# Patient Record
Sex: Female | Born: 1955 | Race: White | Hispanic: No | Marital: Married | Smoking: Never smoker
Health system: Southern US, Community
[De-identification: ages and names within clinical notes are randomized; demographics above are authoritative.]

## PROBLEM LIST (undated history)

## (undated) DIAGNOSIS — Z5189 Encounter for other specified aftercare: Secondary | ICD-10-CM

## (undated) DIAGNOSIS — K219 Gastro-esophageal reflux disease without esophagitis: Secondary | ICD-10-CM

## (undated) DIAGNOSIS — Z860100 Personal history of colon polyps, unspecified: Secondary | ICD-10-CM

## (undated) DIAGNOSIS — M1990 Unspecified osteoarthritis, unspecified site: Secondary | ICD-10-CM

## (undated) DIAGNOSIS — L719 Rosacea, unspecified: Secondary | ICD-10-CM

## (undated) DIAGNOSIS — C801 Malignant (primary) neoplasm, unspecified: Secondary | ICD-10-CM

## (undated) DIAGNOSIS — K649 Unspecified hemorrhoids: Secondary | ICD-10-CM

## (undated) DIAGNOSIS — F419 Anxiety disorder, unspecified: Secondary | ICD-10-CM

## (undated) DIAGNOSIS — T8859XA Other complications of anesthesia, initial encounter: Secondary | ICD-10-CM

## (undated) DIAGNOSIS — R7989 Other specified abnormal findings of blood chemistry: Secondary | ICD-10-CM

## (undated) DIAGNOSIS — E162 Hypoglycemia, unspecified: Secondary | ICD-10-CM

## (undated) HISTORY — DX: Gastro-esophageal reflux disease without esophagitis: K21.9

## (undated) HISTORY — DX: Encounter for other specified aftercare: Z51.89

## (undated) HISTORY — DX: Other specified abnormal findings of blood chemistry: R79.89

## (undated) HISTORY — DX: Unspecified osteoarthritis, unspecified site: M19.90

## (undated) HISTORY — DX: Unspecified hemorrhoids: K64.9

## (undated) HISTORY — DX: Hypoglycemia, unspecified: E16.2

## (undated) HISTORY — DX: Personal history of colon polyps, unspecified: Z86.0100

## (undated) HISTORY — DX: Rosacea, unspecified: L71.9

## (undated) HISTORY — PX: FINGER SURGERY: SHX640

## (undated) HISTORY — DX: Malignant (primary) neoplasm, unspecified: C80.1

## (undated) HISTORY — DX: Anxiety disorder, unspecified: F41.9

## (undated) HISTORY — DX: Personal history of colonic polyps: Z86.010

---

## 1992-05-25 HISTORY — PX: KNEE ARTHROSCOPY: SUR90

## 1993-05-25 HISTORY — PX: TUBAL LIGATION: SHX77

## 1994-05-25 HISTORY — PX: OTHER SURGICAL HISTORY: SHX169

## 2002-05-25 HISTORY — PX: UTERINE FIBROID SURGERY: SHX826

## 2005-05-25 DIAGNOSIS — Z5189 Encounter for other specified aftercare: Secondary | ICD-10-CM

## 2005-05-25 HISTORY — DX: Encounter for other specified aftercare: Z51.89

## 2008-03-22 ENCOUNTER — Encounter: Payer: Self-pay | Admitting: Internal Medicine

## 2008-03-22 ENCOUNTER — Ambulatory Visit: Payer: Self-pay | Admitting: Internal Medicine

## 2008-03-22 DIAGNOSIS — I10 Essential (primary) hypertension: Secondary | ICD-10-CM | POA: Insufficient documentation

## 2008-05-29 ENCOUNTER — Ambulatory Visit: Payer: Self-pay | Admitting: Internal Medicine

## 2008-05-29 DIAGNOSIS — J069 Acute upper respiratory infection, unspecified: Secondary | ICD-10-CM | POA: Insufficient documentation

## 2008-10-02 ENCOUNTER — Ambulatory Visit: Payer: Self-pay | Admitting: Internal Medicine

## 2008-10-02 DIAGNOSIS — L570 Actinic keratosis: Secondary | ICD-10-CM

## 2008-11-06 ENCOUNTER — Telehealth: Payer: Self-pay | Admitting: Internal Medicine

## 2008-12-18 ENCOUNTER — Emergency Department (HOSPITAL_COMMUNITY): Admission: EM | Admit: 2008-12-18 | Discharge: 2008-12-18 | Payer: Self-pay | Admitting: Emergency Medicine

## 2009-07-24 ENCOUNTER — Ambulatory Visit: Payer: Self-pay | Admitting: Internal Medicine

## 2009-07-24 ENCOUNTER — Telehealth (INDEPENDENT_AMBULATORY_CARE_PROVIDER_SITE_OTHER): Payer: Self-pay | Admitting: *Deleted

## 2009-07-24 DIAGNOSIS — M79609 Pain in unspecified limb: Secondary | ICD-10-CM

## 2009-10-24 ENCOUNTER — Telehealth: Payer: Self-pay | Admitting: Internal Medicine

## 2010-02-04 ENCOUNTER — Ambulatory Visit: Payer: Self-pay | Admitting: Internal Medicine

## 2010-02-04 DIAGNOSIS — G5600 Carpal tunnel syndrome, unspecified upper limb: Secondary | ICD-10-CM

## 2010-03-14 ENCOUNTER — Ambulatory Visit: Payer: Self-pay | Admitting: Internal Medicine

## 2010-03-14 DIAGNOSIS — D1739 Benign lipomatous neoplasm of skin and subcutaneous tissue of other sites: Secondary | ICD-10-CM | POA: Insufficient documentation

## 2010-05-25 DIAGNOSIS — C801 Malignant (primary) neoplasm, unspecified: Secondary | ICD-10-CM

## 2010-05-25 HISTORY — DX: Malignant (primary) neoplasm, unspecified: C80.1

## 2010-05-25 HISTORY — PX: MOHS SURGERY: SHX181

## 2010-06-12 ENCOUNTER — Encounter: Payer: Self-pay | Admitting: Internal Medicine

## 2010-06-24 NOTE — Assessment & Plan Note (Signed)
Summary: L arm, shoulder pain x few days   Vital Signs:  Patient profile:   55 year old female Weight:      150 pounds Temp:     98.9 degrees F oral BP sitting:   118 / 78  (right arm) Cuff size:   regular  Vitals Entered By: Duard Brady LPN (July 24, 2593 11:20 AM) CC: c/o (L) arm and shoulder pain x2 days , c/o dizziness on/off x2 wks , (L) hand "tingle"  Is Patient Diabetic? No   Primary Care Provider:  Gordy Savers  MD  CC:  c/o (L) arm and shoulder pain x2 days , c/o dizziness on/off x2 wks , and (L) hand "tingle" .  History of Present Illness: 55 year old patient who presents with a two-day history of vague left arm and shoulder discomfort.  She also describes some mild dizziness.  Associated symptoms include some numbness involving her left lateral arm, extending to the fourth and fifth fingers.  She was concerned about a cardiac etiology.  She denies any exertional chest pain.  Exertion does not aggravate the above symptoms.  There is full range-of-motion of the head, neck, and arms without aggravation of the pain.  She does describe some increasing dyspnea and palpitations that occur rarely with walking up two flights of stairs. She has treated hypertension. She also has a history of multiple actinic keratoses and is requesting dermatology referral  Preventive Screening-Counseling & Management  Alcohol-Tobacco     Smoking Status: never  Allergies (verified): No Known Drug Allergies  Past History:  Past Medical History: Reviewed history from 05/29/2008 and no changes required. Hypertension actinic keratoses history of thyroid nodule psoriasis  Past Surgical History: Reviewed history from 03/22/2008 and no changes required. status post biopsy of thyroid nodule 2009 status post uterine ablation 2000 status post uterine fibroid removal 2007 status post full  Facial resurfacing 96 with laser resurfacing arms and legs  Review of Systems  The  patient denies anorexia, fever, weight loss, weight gain, vision loss, decreased hearing, hoarseness, chest pain, syncope, dyspnea on exertion, peripheral edema, prolonged cough, headaches, hemoptysis, abdominal pain, melena, hematochezia, severe indigestion/heartburn, hematuria, incontinence, genital sores, muscle weakness, suspicious skin lesions, transient blindness, difficulty walking, depression, unusual weight change, abnormal bleeding, enlarged lymph nodes, angioedema, and breast masses.    Physical Exam  General:  Well-developed,well-nourished,in no acute distress; alert,appropriate and cooperative throughout examination Head:  Normocephalic and atraumatic without obvious abnormalities. No apparent alopecia or balding. Eyes:  No corneal or conjunctival inflammation noted. EOMI. Perrla. Funduscopic exam benign, without hemorrhages, exudates or papilledema. Vision grossly normal. Mouth:  Oral mucosa and oropharynx without lesions or exudates.  Teeth in good repair. Neck:  No deformities, masses, or tenderness noted. Lungs:  Normal respiratory effort, chest expands symmetrically. Lungs are clear to auscultation, no crackles or wheezes. Heart:  Normal rate and regular rhythm. S1 and S2 normal without gallop, murmur, click, rub or other extra sounds. Abdomen:  Bowel sounds positive,abdomen soft and non-tender without masses, organomegaly or hernias noted. Msk:  range of motion left shoulder is intact and did not reproduce the pain Neurologic:  strength normal in all extremities, sensation intact to light touch, sensation intact to pinprick, and DTRs symmetrical and normal.   Skin:  fair complexion   Impression & Recommendations:  Problem # 1:  HYPERTENSION (ICD-401.9)  Her updated medication list for this problem includes:    Lisinopril 10 Mg Tabs (Lisinopril) .Marland Kitchen... 1 once daily  Problem # 2:  ARM PAIN, LEFT (ICD-729.5) extremely low likelihood that this represents ischemic pain, which  was the patient's main concern.  Neurologically is intact.  Symptoms may be due to a mild cervical radiculopathy.  In view of her paresthesias.  Will clinically observe at this time  Complete Medication List: 1)  Lisinopril 10 Mg Tabs (Lisinopril) .Marland Kitchen.. 1 once daily 2)  Hydrocodone-homatropine 5-1.5 Mg/55ml Syrp (Hydrocodone-homatropine) .Marland Kitchen.. 1 teaspoon every 6 hours for cough 3)  Halobetasol Propionate 0.05 % Crea (Halobetasol propionate) .... Use twice daily  Other Orders: Dermatology Referral (Derma)  Patient Instructions: 1)  call if symptoms  do not improve or worsen 2)  Limit your Sodium (Salt). 3)  It is important that you exercise regularly at least 20 minutes 5 times a week. If you develop chest pain, have severe difficulty breathing, or feel very tired , stop exercising immediately and seek medical attention. 4)  Take calcium +Vitamin D daily. 5)  dermatology follow-up is recommended

## 2010-06-24 NOTE — Progress Notes (Signed)
Summary: arm pain  Phone Note Call from Patient Call back at Home Phone [phone]   Caller: Patient Call For: Gordy Savers  MD Summary of Call: C/o dizziness, L arm and shoulder pain x few days. No SOB, nausea or weakness. Initial call taken by: Raechel Ache, RN,  July 24, 2009 10:04 AM  Follow-up for Phone Call        appt 11:15 today Dr Kirtland Bouchard. Follow-up by: Raechel Ache, RN,  July 24, 2009 10:04 AM

## 2010-06-24 NOTE — Assessment & Plan Note (Signed)
Summary: PAIN IN BOTH WRISTS/NUMBNESS IN FINGERS FOR 7 WKS/PAIN LFT EL...   Vital Signs:  Patient profile:   55 year old female Weight:      148 pounds Temp:     98.7 degrees F oral BP sitting:   108 / 70  (left arm) Cuff size:   regular  Vitals Entered By: Duard Brady LPN (February 04, 2010 2:33 PM)  CC: numbness in fingers running up to forearms , elbow pain - pain is worse at night, Hypertension Management Is Patient Diabetic? No   Primary Care Provider:  Gordy Savers  MD  CC:  numbness in fingers running up to forearms , elbow pain - pain is worse at night, and Hypertension Management.  History of Present Illness: 55 year old patient, who presents with a 7 to 8 week history of pain and numbness involving both hands.  The left greater than the right.  Pain is in the distribution of the thumb, second, and third fingers.  She has pain on the left as far as the elbow.  Pain is worse at night and interferes with sleep.  She has been using anti-inflammatory medication and wrist splints.  She has treated hypertension, which has been stable .  He also complains of an A. K. about her left elbow area and is requesting cryotherapy  Hypertension History:      Positive major cardiovascular risk factors include hypertension.  Negative major cardiovascular risk factors include female age less than 77 years old and non-tobacco-user status.     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 1 once daily 2)  Halobetasol Propionate 0.05 % Crea (Halobetasol propionate) .... Use twice daily 3)  Diclofenac Sodium 75 Mg Tbec (Diclofenac sodium) .... Given to her by doctor at work  Hypertension Assessment/Plan:      The patient's hypertensive risk group is category A: No risk factors and no target organ damage.  Today's blood pressure is 108/70.    Patient Instructions: 1)  Limit your Sodium (Salt). 2)  It is important that you exercise regularly at least 20 minutes 5 times a week. If you develop chest pain, have severe difficulty breathing, or feel very tired , stop exercising immediately and seek medical attention.

## 2010-06-24 NOTE — Progress Notes (Signed)
Summary: REFILL REQUEST  Phone Note Call from Patient   Caller: Patient  [phone] Summary of Call: Pt called to adv that [facility] [address] is advising her that they have not received any info on a refill for med: LISINOPRIL 10 MG ..... Pt req that they be contacted and adv of refill authorization.Marland KitchenMarland KitchenMarland KitchenPt was advised of notation indicating refill on 10/22/2009 but pt adv [facility] is stating [facility] have not [facility] anything...Marland KitchenMarland KitchenMarland Kitchen Pt can be reached at [phone].  Initial call taken by: Debbra Riding,  October 24, 2009 12:28 PM  Follow-up for Phone Call        attempt to call - number taken recorded wrong? - called hm# - ans mach - left msg that [facility] at [facility] Follow-up by: Duard Brady LPN,  October 25, 6642 2:35 PM

## 2010-06-24 NOTE — Assessment & Plan Note (Signed)
Summary: consult IR:WERXV filled lump on back/cjr   Vital Signs:  Patient profile:   55 year old female Height:      64.5 inches Weight:      142 pounds BMI:     24.08 Temp:     98.2 degrees F oral Pulse rate:   72 / minute Resp:     14 per minute BP sitting:   120 / 80  (left arm)  Vitals Entered By: Willy Eddy, LPN (March 14, 2010 8:19 AM) CC: c/o lipoma type lesion on back with no pain Is Patient Diabetic? No   Primary Care Provider:  Gordy Savers  MD  CC:  c/o lipoma type lesion on back with no pain.  History of Present Illness: 55 year old patient who has treated hypertension.  For the past several weeks she has  noted a painless subcutaneous nodule in the right mid back region.  Over this period  of observation.  It has not changed.  This was initially noted by her massage therapist.  She has treated hypertension, which has been stable.  Preventive Screening-Counseling & Management  Alcohol-Tobacco     Smoking Status: never  Current Problems (verified): 1)  Carpal Tunnel Syndrome, Bilateral  (ICD-354.0) 2)  Arm Pain, Left  (ICD-729.5) 3)  Actinic Keratosis  (ICD-702.0) 4)  Uri  (ICD-465.9) 5)  Preventive Health Care  (ICD-V70.0) 6)  Hypertension  (ICD-401.9)  Current Medications (verified): 1)  Lisinopril 10 Mg Tabs (Lisinopril) .Marland Kitchen.. 1 Once Daily 2)  Halobetasol Propionate 0.05 % Crea (Halobetasol Propionate) .... Use Twice Daily  Allergies (verified): No Known Drug Allergies  Contraindications/Deferment of Procedures/Staging:    Test/Procedure: Pneumovax vaccine    Reason for deferment: patient declined   Past History:  Past Medical History: Reviewed history from 05/29/2008 and no changes required. Hypertension actinic keratoses history of thyroid nodule psoriasis  Past Surgical History: Reviewed history from 03/22/2008 and no changes required. status post biopsy of thyroid nodule 2009 status post uterine ablation 2000 status post  uterine fibroid removal 2007 status post full  Facial resurfacing 96 with laser resurfacing arms and legs  Review of Systems       The patient complains of suspicious skin lesions.  The patient denies anorexia, fever, weight loss, weight gain, vision loss, decreased hearing, hoarseness, chest pain, syncope, dyspnea on exertion, peripheral edema, prolonged cough, headaches, hemoptysis, abdominal pain, melena, hematochezia, severe indigestion/heartburn, hematuria, incontinence, genital sores, muscle weakness, transient blindness, difficulty walking, depression, unusual weight change, abnormal bleeding, enlarged lymph nodes, angioedema, and breast masses.    Physical Exam  General:  Well-developed,well-nourished,in no acute distress; alert,appropriate and cooperative throughout examination; 118/70 Skin:  approximate 3 x 4 soft, freely movable subcutaneous nodule in the right mid back region, consistent with a lipoma   Impression & Recommendations:  Problem # 1:  LIPOMA, SKIN (ICD-214.1)  Problem # 2:  HYPERTENSION (ICD-401.9)  Her updated medication list for this problem includes:    Lisinopril 10 Mg Tabs (Lisinopril) .Marland Kitchen... 1 once daily  Her updated medication list for this problem includes:    Lisinopril 10 Mg Tabs (Lisinopril) .Marland Kitchen... 1 once daily  Complete Medication List: 1)  Lisinopril 10 Mg Tabs (Lisinopril) .Marland Kitchen.. 1 once daily 2)  Halobetasol Propionate 0.05 % Crea (Halobetasol propionate) .... Use twice daily  Patient Instructions: 1)  call if the nodule involving the right mid back area enlarges or becomes painful or red 2)  Limit your Sodium (Salt). 3)  Check your Blood  Pressure regularly. If it is above: 150/90 you should make an appointment.   Orders Added: 1)  Est. Patient Level III [16109]

## 2010-07-02 NOTE — Letter (Signed)
Summary: [facility]   Imported By: Maryln Gottron 06/27/2010 12:43:59  _____________________________________________________________________  External Attachment:    Type:   Image     Comment:   External Document

## 2010-07-21 ENCOUNTER — Other Ambulatory Visit: Payer: Self-pay | Admitting: Internal Medicine

## 2010-11-28 ENCOUNTER — Other Ambulatory Visit: Payer: Self-pay | Admitting: Obstetrics & Gynecology

## 2010-11-28 DIAGNOSIS — Z1231 Encounter for screening mammogram for malignant neoplasm of breast: Secondary | ICD-10-CM

## 2010-12-08 ENCOUNTER — Ambulatory Visit
Admission: RE | Admit: 2010-12-08 | Discharge: 2010-12-08 | Disposition: A | Discharge status: Home / Self Care | Source: Ambulatory Visit | Attending: Obstetrics & Gynecology | Admitting: Obstetrics & Gynecology

## 2010-12-08 DIAGNOSIS — Z1231 Encounter for screening mammogram for malignant neoplasm of breast: Secondary | ICD-10-CM

## 2010-12-11 ENCOUNTER — Ambulatory Visit: Payer: Self-pay

## 2011-05-23 ENCOUNTER — Other Ambulatory Visit: Payer: Self-pay | Admitting: Internal Medicine

## 2011-11-03 ENCOUNTER — Other Ambulatory Visit: Payer: Self-pay | Admitting: Obstetrics & Gynecology

## 2011-11-03 DIAGNOSIS — Z1231 Encounter for screening mammogram for malignant neoplasm of breast: Secondary | ICD-10-CM

## 2011-12-09 ENCOUNTER — Ambulatory Visit
Admission: RE | Admit: 2011-12-09 | Discharge: 2011-12-09 | Disposition: A | Discharge status: Home / Self Care | Source: Ambulatory Visit | Attending: Obstetrics & Gynecology | Admitting: Obstetrics & Gynecology

## 2011-12-09 DIAGNOSIS — Z1231 Encounter for screening mammogram for malignant neoplasm of breast: Secondary | ICD-10-CM

## 2012-11-01 ENCOUNTER — Other Ambulatory Visit: Payer: Self-pay

## 2012-11-01 DIAGNOSIS — Z1231 Encounter for screening mammogram for malignant neoplasm of breast: Secondary | ICD-10-CM

## 2012-12-09 ENCOUNTER — Ambulatory Visit
Admission: RE | Admit: 2012-12-09 | Discharge: 2012-12-09 | Disposition: A | Discharge status: Home / Self Care | Source: Ambulatory Visit

## 2012-12-09 DIAGNOSIS — Z1231 Encounter for screening mammogram for malignant neoplasm of breast: Secondary | ICD-10-CM

## 2012-12-22 ENCOUNTER — Other Ambulatory Visit: Payer: Self-pay | Admitting: Obstetrics & Gynecology

## 2012-12-22 DIAGNOSIS — Z780 Asymptomatic menopausal state: Secondary | ICD-10-CM

## 2013-01-02 ENCOUNTER — Ambulatory Visit
Admission: RE | Admit: 2013-01-02 | Discharge: 2013-01-02 | Disposition: A | Discharge status: Home / Self Care | Source: Ambulatory Visit | Attending: Obstetrics & Gynecology | Admitting: Obstetrics & Gynecology

## 2013-01-02 DIAGNOSIS — Z780 Asymptomatic menopausal state: Secondary | ICD-10-CM

## 2013-11-07 ENCOUNTER — Other Ambulatory Visit: Payer: Self-pay

## 2013-11-07 DIAGNOSIS — Z1231 Encounter for screening mammogram for malignant neoplasm of breast: Secondary | ICD-10-CM

## 2013-12-11 ENCOUNTER — Ambulatory Visit
Admission: RE | Admit: 2013-12-11 | Discharge: 2013-12-11 | Disposition: A | Discharge status: Home / Self Care | Source: Ambulatory Visit

## 2013-12-11 DIAGNOSIS — Z1231 Encounter for screening mammogram for malignant neoplasm of breast: Secondary | ICD-10-CM

## 2014-11-07 ENCOUNTER — Other Ambulatory Visit: Payer: Self-pay

## 2014-11-07 DIAGNOSIS — Z1231 Encounter for screening mammogram for malignant neoplasm of breast: Secondary | ICD-10-CM

## 2014-12-14 ENCOUNTER — Ambulatory Visit
Admission: RE | Admit: 2014-12-14 | Discharge: 2014-12-14 | Disposition: A | Discharge status: Home / Self Care | Source: Ambulatory Visit

## 2014-12-14 DIAGNOSIS — Z1231 Encounter for screening mammogram for malignant neoplasm of breast: Secondary | ICD-10-CM

## 2015-11-27 ENCOUNTER — Other Ambulatory Visit: Payer: Self-pay | Admitting: Internal Medicine

## 2015-11-27 ENCOUNTER — Ambulatory Visit
Admission: RE | Admit: 2015-11-27 | Discharge: 2015-11-27 | Disposition: A | Discharge status: Home / Self Care | Source: Ambulatory Visit | Attending: Internal Medicine | Admitting: Internal Medicine

## 2015-11-27 DIAGNOSIS — M25532 Pain in left wrist: Secondary | ICD-10-CM

## 2015-11-27 DIAGNOSIS — M25531 Pain in right wrist: Secondary | ICD-10-CM

## 2015-11-27 IMAGING — CR DG WRIST COMPLETE 3+V*L*
4 series · 4 of 4 positions shown · non-contrast
Comparison: No recent.

CLINICAL DATA: Fall.  Initial evaluation .

EXAM:
LEFT WRIST - COMPLETE 3+ VIEW

[x wrist pa left]
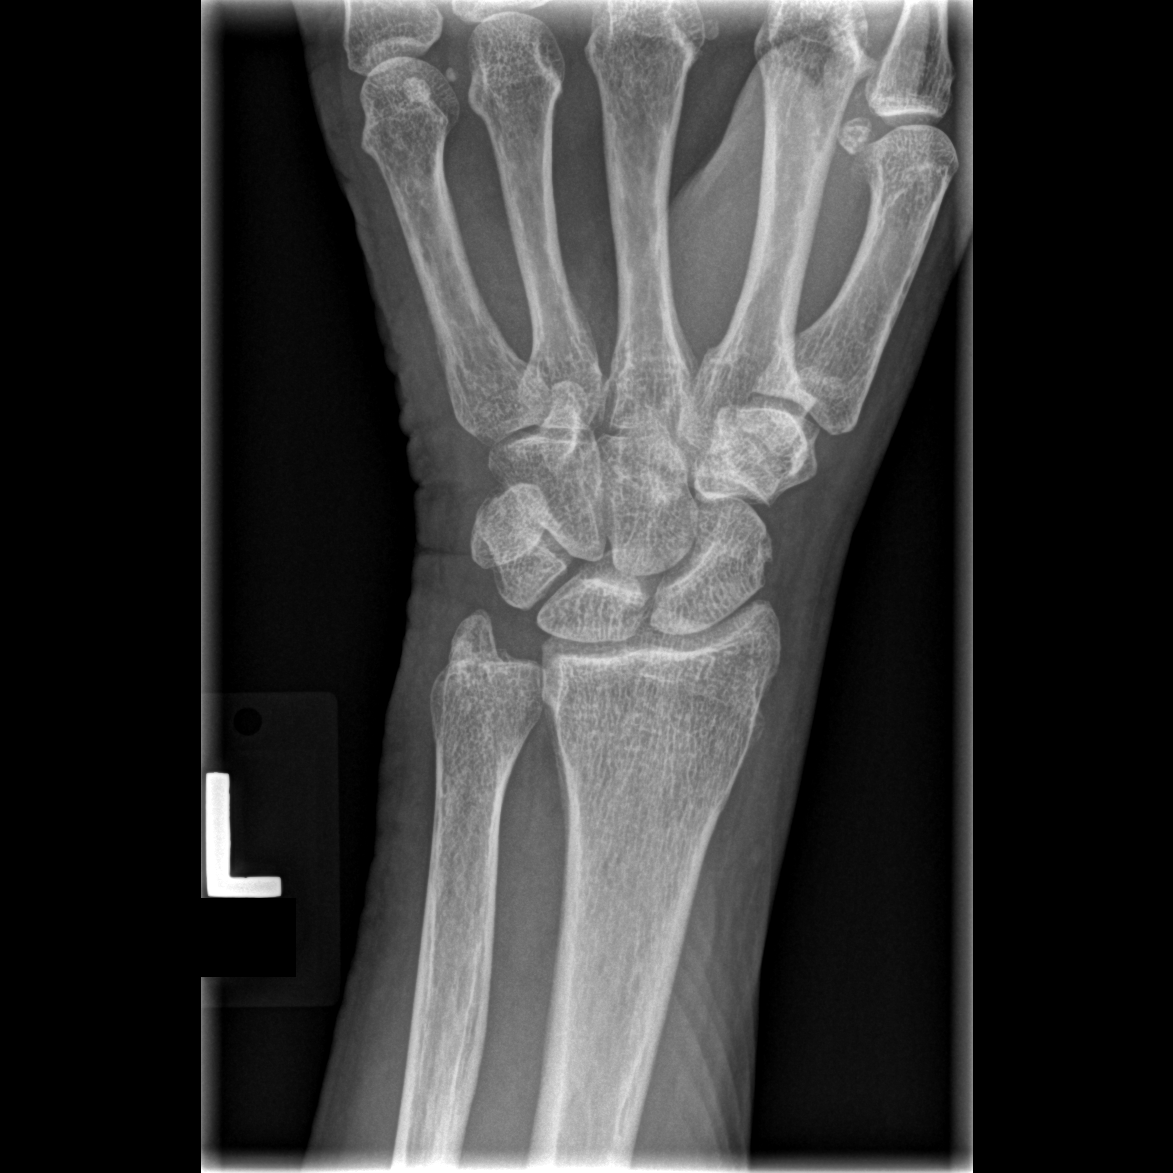

[x wrist obl left]
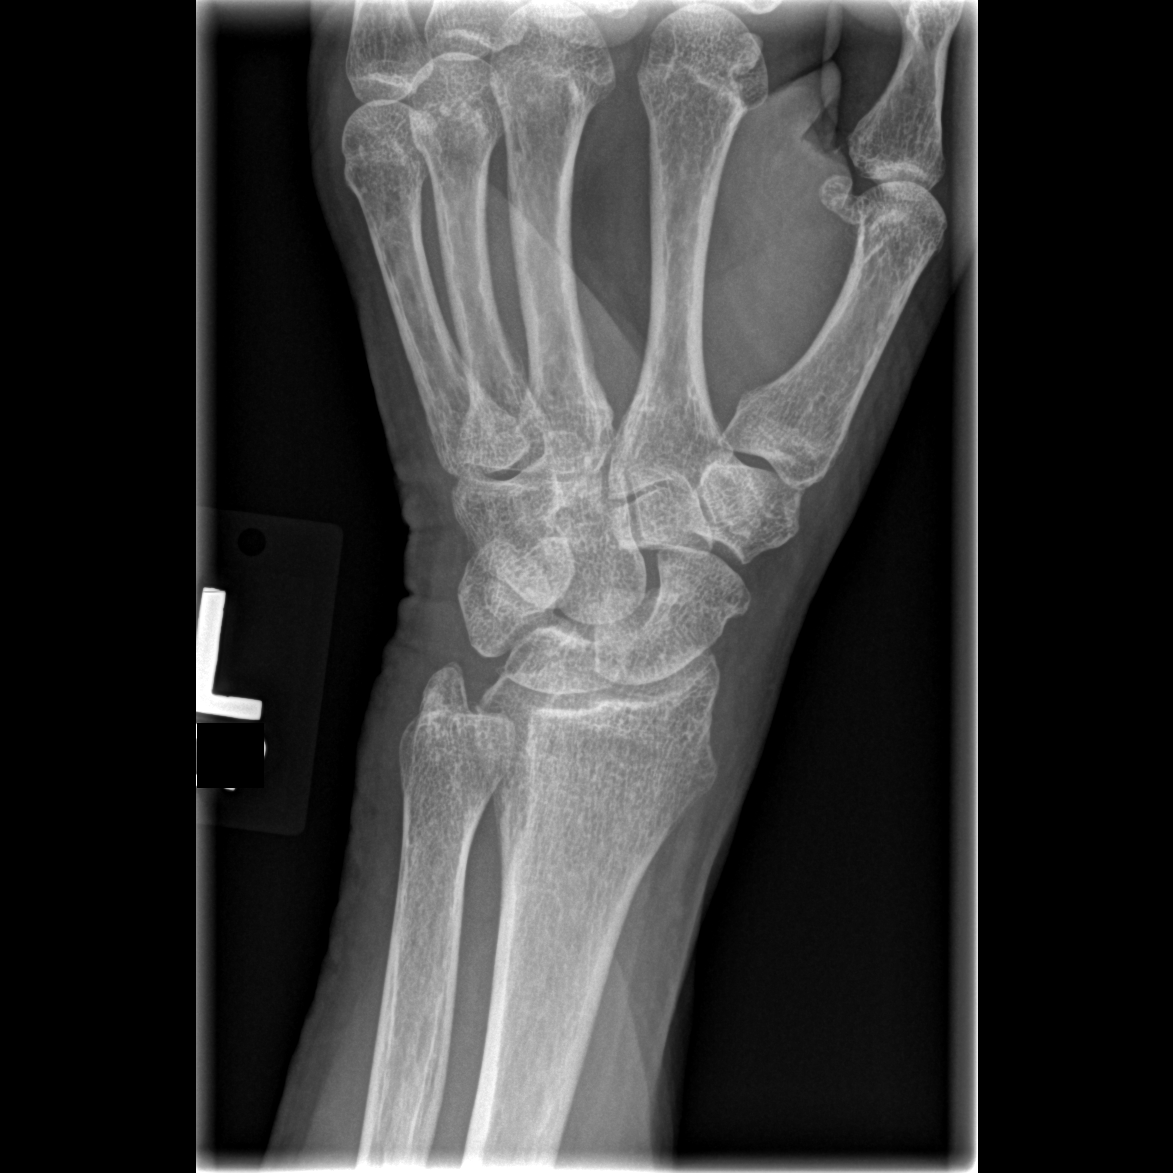

[x wrist lat left]
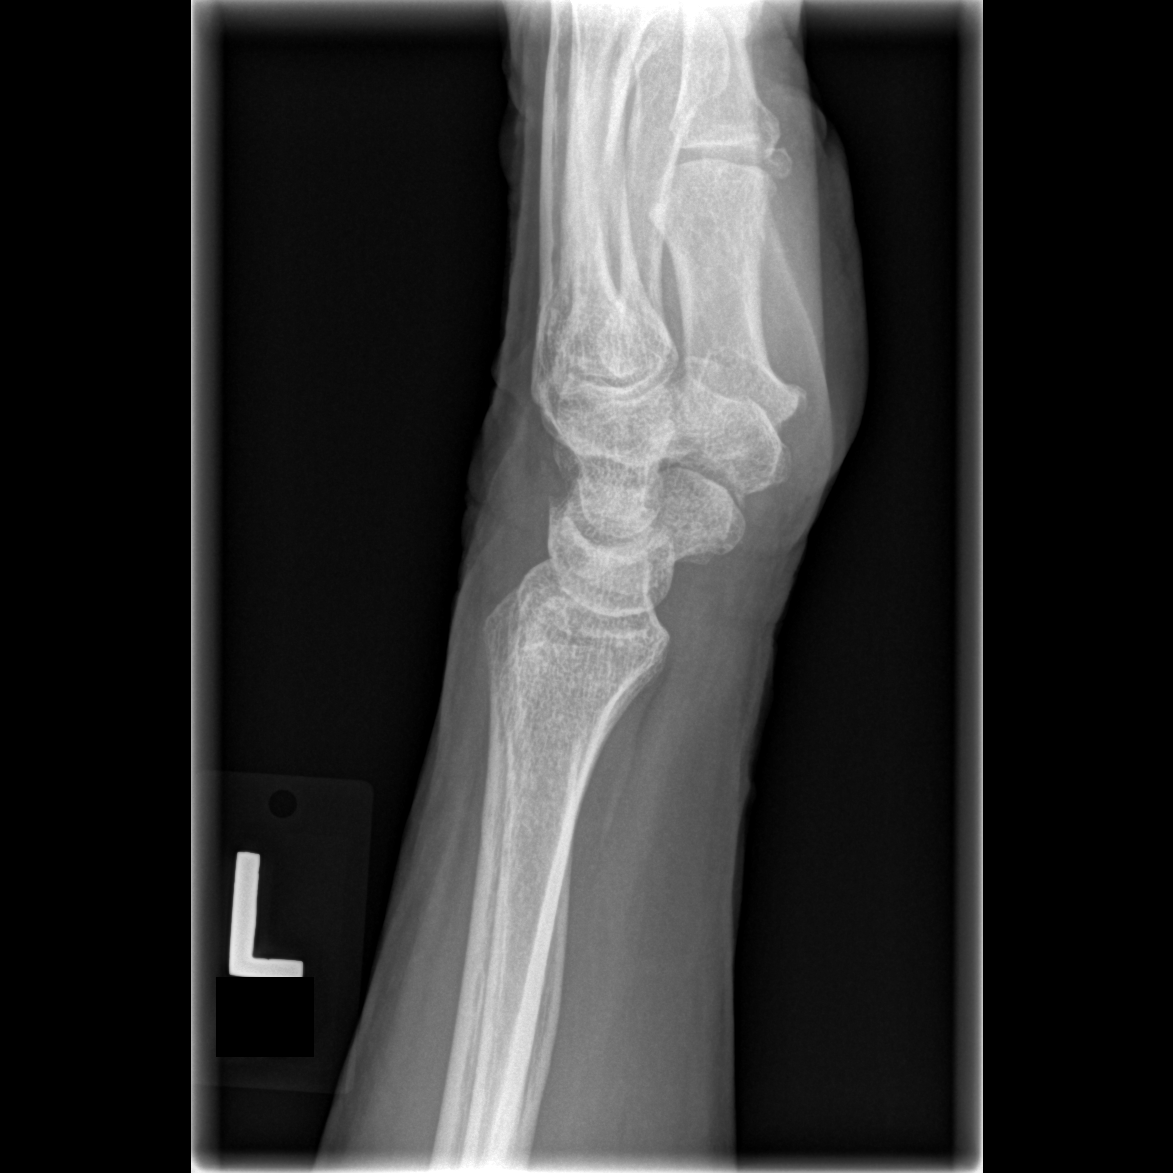

[x navicular]
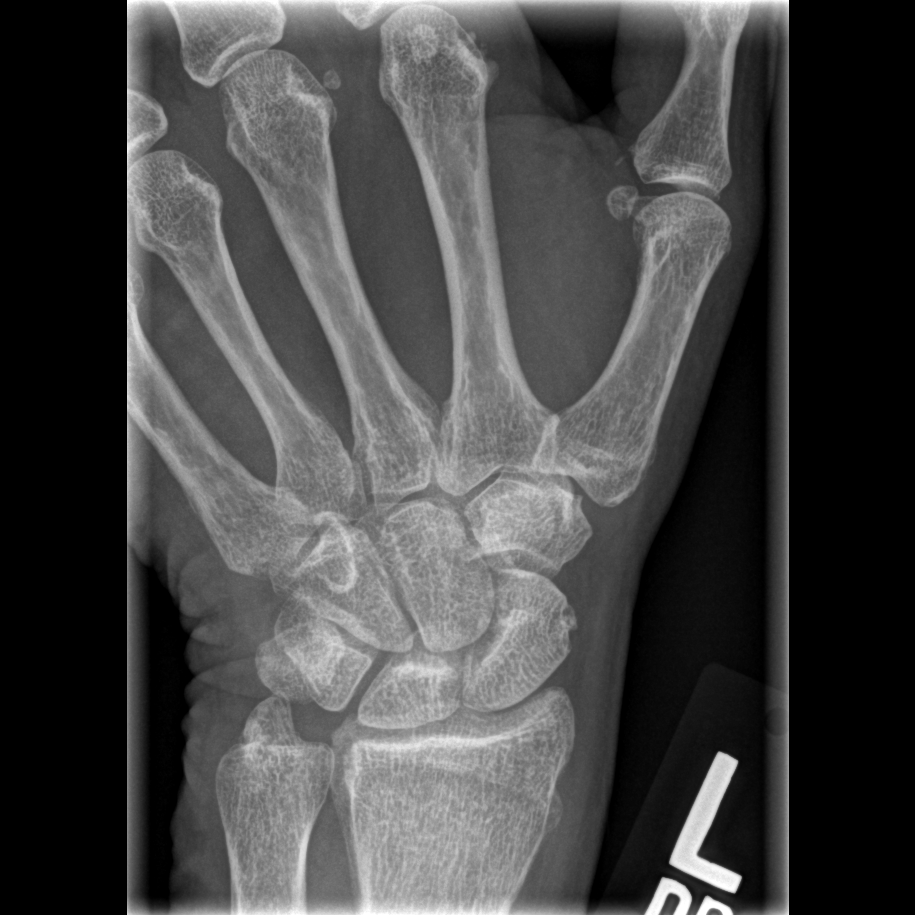

[4 of 4 positions shown; findings below may reference images not displayed]

FINDINGS: Diffuse degenerative change. No acute bony or joint abnormality
identified. No focal abnormality identified.
IMPRESSION: Diffuse mild degenerative change.  No acute abnormality.

## 2016-01-15 ENCOUNTER — Encounter: Payer: Self-pay | Admitting: Gastroenterology

## 2016-03-06 ENCOUNTER — Encounter: Payer: Self-pay | Admitting: Gastroenterology

## 2016-03-06 ENCOUNTER — Ambulatory Visit (AMBULATORY_SURGERY_CENTER): Payer: Self-pay | Admitting: *Deleted

## 2016-03-06 ENCOUNTER — Telehealth: Payer: Self-pay | Admitting: Gastroenterology

## 2016-03-06 VITALS — Ht 64.5 in | Wt 146.4 lb

## 2016-03-06 DIAGNOSIS — Z1211 Encounter for screening for malignant neoplasm of colon: Secondary | ICD-10-CM

## 2016-03-06 MED ORDER — NA SULFATE-K SULFATE-MG SULF 17.5-3.13-1.6 GM/177ML PO SOLN: 1.0000 | Freq: Once | ORAL | 0 refills | Status: AC

## 2016-03-06 NOTE — Progress Notes (Signed)
No allergies to eggs or soy. No problems with anesthesia.  Pt given Emmi instructions for colonoscopy  No oxygen use  No diet drug use  

## 2016-03-09 ENCOUNTER — Telehealth: Payer: Self-pay | Admitting: Gastroenterology

## 2016-03-09 NOTE — Telephone Encounter (Signed)
L/M that we are waiting on Suprep sample kits to come in and we will give her a call when they arrive

## 2016-03-09 NOTE — Telephone Encounter (Signed)
See other phone note. Waiting on shipment of Suprep to come in. Abran Cantor staff message to put patient on the list

## 2016-03-09 NOTE — Telephone Encounter (Signed)
Sent Magda Paganini message to give this patient the sample

## 2016-03-12 NOTE — Telephone Encounter (Signed)
I haven't called her yet.  Calling most of the people on the list tomorrow and I will document it

## 2016-03-12 NOTE — Telephone Encounter (Signed)
Ana Graves this is the phone note where patient called for the prep   I think you have called her already

## 2016-03-13 NOTE — Telephone Encounter (Signed)
Patient understands that she will get a call sometime next week about her Suprep kit when it comes in

## 2016-03-16 ENCOUNTER — Telehealth: Payer: Self-pay

## 2016-03-16 NOTE — Telephone Encounter (Signed)
Spoke with patient and told her I would leave

## 2016-03-17 NOTE — Telephone Encounter (Signed)
Patient instructed to pick up sample in the office.  Patient agreed

## 2016-03-19 ENCOUNTER — Telehealth: Payer: Self-pay | Admitting: Gastroenterology

## 2016-03-19 NOTE — Telephone Encounter (Signed)
Pt calling to make sure we will have d 5 available tomorrow morning  If her blood sugar is low.  Instructed her that we have d 5 on hand always, we will check her sugar . Pt states she cannot have sugar , it will send her over the edge and she will have to go to the ER.  States she cannot have juices, sodas, jellos. Instructed to do broths and what ever she can tolerate today.   Pt states she ate today, she  states  She gets most of her calories in the morning before 12 so she ate despite knowing she was not supposed to.  She states she has had a bm so she isnt worried about being cleaned out. She will now just do her liquids.   She told me her husband was planning on running errands while she was here, instructed her that her husband cannot leave the waiting room, that he has to be here the whole time she is and if he leaves she will be skipped until we find him so he needs to plan on being here and not leaving, she verbalized understanding of this.  Call with questions, Lenard Galloway RN

## 2016-03-20 ENCOUNTER — Encounter: Payer: Self-pay | Admitting: Gastroenterology

## 2016-03-20 ENCOUNTER — Ambulatory Visit (AMBULATORY_SURGERY_CENTER): Admitting: Gastroenterology

## 2016-03-20 VITALS — BP 123/73 | HR 70 | Temp 98.9°F | Resp 15 | Ht 64.0 in | Wt 146.0 lb

## 2016-03-20 DIAGNOSIS — D125 Benign neoplasm of sigmoid colon: Secondary | ICD-10-CM

## 2016-03-20 DIAGNOSIS — Z1211 Encounter for screening for malignant neoplasm of colon: Secondary | ICD-10-CM

## 2016-03-20 DIAGNOSIS — D123 Benign neoplasm of transverse colon: Secondary | ICD-10-CM | POA: Diagnosis not present

## 2016-03-20 DIAGNOSIS — Z1212 Encounter for screening for malignant neoplasm of rectum: Secondary | ICD-10-CM

## 2016-03-20 DIAGNOSIS — K635 Polyp of colon: Secondary | ICD-10-CM

## 2016-03-20 DIAGNOSIS — D122 Benign neoplasm of ascending colon: Secondary | ICD-10-CM | POA: Diagnosis not present

## 2016-03-20 DIAGNOSIS — D124 Benign neoplasm of descending colon: Secondary | ICD-10-CM

## 2016-03-20 MED ORDER — SODIUM CHLORIDE 0.9 % IV SOLN: 500.0000 mL | INTRAVENOUS | Status: DC

## 2016-03-20 NOTE — Progress Notes (Signed)
Called to room to assist during endoscopic procedure.  Patient ID and intended procedure confirmed with present staff. Received instructions for my participation in the procedure from the performing physician.  

## 2016-03-20 NOTE — Patient Instructions (Signed)
Discharge instructions given. Handouts on polyps and hemorrhoids. Resume previous medications. YOU HAD AN ENDOSCOPIC PROCEDURE TODAY AT THE [facility]:   Refer to the procedure report that was given to you for any specific questions about what was found during the examination.  If the procedure report does not answer your questions, please call your gastroenterologist to clarify.  If you requested that your care partner not be given the details of your procedure findings, then the procedure report has been included in a sealed envelope for you to review at your convenience later.  YOU SHOULD EXPECT: Some feelings of bloating in the abdomen. Passage of more gas than usual.  Walking can help get rid of the air that was put into your GI tract during the procedure and reduce the bloating. If you had a lower endoscopy (such as a colonoscopy or flexible sigmoidoscopy) you may notice spotting of blood in your stool or on the toilet paper. If you underwent a bowel prep for your procedure, you may not have a normal bowel movement for a few days.  Please Note:  You might notice some irritation and congestion in your nose or some drainage.  This is from the oxygen used during your procedure.  There is no need for concern and it should clear up in a day or so.  SYMPTOMS TO REPORT IMMEDIATELY:   Following lower endoscopy (colonoscopy or flexible sigmoidoscopy):  Excessive amounts of blood in the stool  Significant tenderness or worsening of abdominal pains  Swelling of the abdomen that is new, acute  Fever of 100F or higher   For urgent or emergent issues, a gastroenterologist can be reached at any hour by calling [phone].   DIET:  We do recommend a small meal at first, but then you may proceed to your regular diet.  Drink plenty of fluids but you should avoid alcoholic beverages for 24 hours.  ACTIVITY:  You should plan to take it easy for the rest of today and you should NOT DRIVE  or use heavy machinery until tomorrow (because of the sedation medicines used during the test).    FOLLOW UP: Our staff will call the number listed on your records the next business day following your procedure to check on you and address any questions or concerns that you may have regarding the information given to you following your procedure. If we do not reach you, we will leave a message.  However, if you are feeling well and you are not experiencing any problems, there is no need to return our call.  We will assume that you have returned to your regular daily activities without incident.  If any biopsies were taken you will be contacted by phone or by letter within the next 1-3 weeks.  Please call us at [phone] if you have not heard about the biopsies in 3 weeks.    SIGNATURES/CONFIDENTIALITY: You and/or your care partner have signed paperwork which will be entered into your electronic medical record.  These signatures attest to the fact that that the information above on your After Visit Summary has been reviewed and is understood.  Full responsibility of the confidentiality of this discharge information lies with you and/or your care-partner. 

## 2016-03-20 NOTE — Op Note (Signed)
[facility] Patient Name: Ana Graves Procedure Date: 03/20/2016 8:46 AM MRN: [id number] Endoscopist: Mauri Pole , MD Age: 60 Referring MD:  Date of Birth: [birth year] Gender: Female Account #: [id number] Procedure:                Colonoscopy Indications:              Screening for colorectal malignant neoplasm Medicines:                Monitored Anesthesia Care Procedure:                Pre-Anesthesia Assessment:                           - Prior to the procedure, a History and Physical                            was performed, and patient medications and                            allergies were reviewed. The patient's tolerance of                            previous anesthesia was also reviewed. The risks                            and benefits of the procedure and the sedation                            options and risks were discussed with the patient.                            All questions were answered, and informed consent                            was obtained. Prior Anticoagulants: The patient has                            taken no previous anticoagulant or antiplatelet                            agents. ASA Grade Assessment: II - A patient with                            mild systemic disease. After reviewing the risks                            and benefits, the patient was deemed in                            satisfactory condition to undergo the procedure.                           After obtaining informed consent, the colonoscope  was passed under direct vision. Throughout the                            procedure, the patient's blood pressure, pulse, and                            oxygen saturations were monitored continuously. The                            Model PCF-H190L [id number]) scope was introduced                            through the anus and advanced to the the terminal   ileum, with identification of the appendiceal                            orifice and IC valve. The colonoscopy was performed                            without difficulty. The patient tolerated the                            procedure well. The quality of the bowel                            preparation was excellent. The terminal ileum,                            ileocecal valve, appendiceal orifice, and rectum                            were photographed. The quality of the bowel                            preparation was evaluated using the BBPS [facility]                            Bowel Preparation Scale) with scores of: Right                            Colon = 3, Transverse Colon = 3 and Left Colon = 3                            (entire mucosa seen well with no residual staining,                            small fragments of stool or opaque liquid). The                            total BBPS score equals 9. Scope In: 8:48:00 AM Scope Out: 9:04:39 AM Scope Withdrawal Time: 0 hours 11 minutes 1 second  Total Procedure Duration: 0 hours 16 minutes 39 seconds  Findings:  The perianal and digital rectal examinations were                            normal.                           A 2 mm polyp was found in the ascending colon. The                            polyp was sessile. The polyp was removed with a                            cold biopsy forceps. Resection and retrieval were                            complete.                           Three sessile polyps were found in the sigmoid                            colon and descending colon. The polyps were 4 to 5                            mm in size. These polyps were removed with a cold                            snare. Resection and retrieval were complete.                           Non-bleeding internal hemorrhoids were found during                            retroflexion. The hemorrhoids were small. Complications:             No immediate complications. Estimated Blood Loss:     Estimated blood loss was minimal. Impression:               - One 2 mm polyp in the ascending colon, removed                            with a cold biopsy forceps. Resected and retrieved.                           - Three 4 to 5 mm polyps in the sigmoid colon and                            in the descending colon, removed with a cold snare.                            Resected and retrieved.                           - Non-bleeding internal hemorrhoids. Recommendation:           -  Patient has a contact number available for                            emergencies. The signs and symptoms of potential                            delayed complications were discussed with the                            patient. Return to normal activities tomorrow.                            Written discharge instructions were provided to the                            patient.                           - Resume previous diet.                           - Continue present medications.                           - Await pathology results.                           - Repeat colonoscopy in 3 - 5 years for                            surveillance based on pathology results. Mauri Pole, MD 03/20/2016 9:09:05 AM This report has been signed electronically.

## 2016-03-20 NOTE — Progress Notes (Signed)
To recovery, report to McCoy, RN, VSS 

## 2016-03-23 ENCOUNTER — Telehealth: Payer: Self-pay | Admitting: *Deleted

## 2016-03-23 NOTE — Telephone Encounter (Signed)
  Follow up Call-  Call back number 03/20/2016  Post procedure Call Back phone  # [phone]  Permission to leave phone message Yes  Some recent data might be hidden     Patient questions:  Do you have a fever, pain , or abdominal swelling? No. Pain Score  0 *  Have you tolerated food without any problems? Yes.    Have you been able to return to your normal activities? Yes.    Do you have any questions about your discharge instructions: Diet   No. Medications  No. Follow up visit  No.  Do you have questions or concerns about your Care? No.  Actions: * If pain score is 4 or above: No action needed, pain <4.

## 2016-03-29 ENCOUNTER — Encounter: Payer: Self-pay | Admitting: Gastroenterology

## 2016-04-02 ENCOUNTER — Emergency Department (HOSPITAL_COMMUNITY)

## 2016-04-02 ENCOUNTER — Emergency Department (HOSPITAL_COMMUNITY)
Admission: EM | Admit: 2016-04-02 | Discharge: 2016-04-02 | Disposition: A | Discharge status: Home / Self Care | Attending: Emergency Medicine | Admitting: Emergency Medicine

## 2016-04-02 ENCOUNTER — Encounter (HOSPITAL_COMMUNITY): Payer: Self-pay

## 2016-04-02 DIAGNOSIS — S2232XA Fracture of one rib, left side, initial encounter for closed fracture: Secondary | ICD-10-CM | POA: Diagnosis not present

## 2016-04-02 DIAGNOSIS — Y999 Unspecified external cause status: Secondary | ICD-10-CM | POA: Diagnosis not present

## 2016-04-02 DIAGNOSIS — W0110XA Fall on same level from slipping, tripping and stumbling with subsequent striking against unspecified object, initial encounter: Secondary | ICD-10-CM | POA: Diagnosis not present

## 2016-04-02 DIAGNOSIS — Z85828 Personal history of other malignant neoplasm of skin: Secondary | ICD-10-CM | POA: Insufficient documentation

## 2016-04-02 DIAGNOSIS — I10 Essential (primary) hypertension: Secondary | ICD-10-CM | POA: Diagnosis not present

## 2016-04-02 DIAGNOSIS — W19XXXA Unspecified fall, initial encounter: Secondary | ICD-10-CM

## 2016-04-02 DIAGNOSIS — Y93K1 Activity, walking an animal: Secondary | ICD-10-CM | POA: Diagnosis not present

## 2016-04-02 DIAGNOSIS — Y929 Unspecified place or not applicable: Secondary | ICD-10-CM | POA: Insufficient documentation

## 2016-04-02 DIAGNOSIS — S299XXA Unspecified injury of thorax, initial encounter: Secondary | ICD-10-CM | POA: Diagnosis present

## 2016-04-02 MED ORDER — HYDROCODONE-ACETAMINOPHEN 5-325 MG PO TABS: 2.0000 | ORAL_TABLET | ORAL | 0 refills | Status: DC | PRN

## 2016-04-02 NOTE — ED Notes (Signed)
Patient left AMA. Stated "I have company coming over". Did not want to wait on discharge paperwork.

## 2016-04-02 NOTE — ED Notes (Signed)
Refused vitals 

## 2016-04-02 NOTE — ED Triage Notes (Signed)
Pt tripped and fell.  Landed on left rib.  No head injury or LOC

## 2016-04-02 NOTE — ED Provider Notes (Signed)
[facility] DEPT Provider Note   CSN: [id number] Arrival date & time: 04/02/16  1046     History   Chief Complaint Chief Complaint  Patient presents with  . Fall  . rib injury    HPI Ana Graves is a 60 y.o. female.  The history is provided by the patient.  Fall  This is a new problem. The problem occurs constantly. The problem has been gradually worsening. Nothing aggravates the symptoms. Nothing relieves the symptoms. She has tried nothing for the symptoms. The treatment provided no relief.   Pt reports she fell while walking her dog.  Pt reports dog pulled and she fell hitting her left ribs.  Pt complains of pain with a deep breath. Past Medical History:  Diagnosis Date  . Blood transfusion without reported diagnosis 2007  . Cancer [facility]) 2012   skin cancer; face  . Hypoglycemia     Patient Active Problem List   Diagnosis Date Noted  . 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 CARPAL TUNNEL SYNDROME, BILATERAL 02/04/2010  . ARM PAIN, LEFT 07/24/2009  . ACTINIC KERATOSIS 10/02/2008  . URI 05/29/2008  . HYPERTENSION 03/22/2008    Past Surgical History:  Procedure Laterality Date  . KNEE ARTHROSCOPY Left 1994  . MOHS SURGERY  2012  . OTHER SURGICAL HISTORY  1996   surgery for skin cancer  . TUBAL LIGATION  1995  . UTERINE FIBROID SURGERY  2004    OB History    No data available       Home Medications    Prior to Admission medications   Medication Sig Start Date End Date Taking? Authorizing Provider  Calcium Carbonate Antacid (TUMS E-X PO) Take by mouth daily.    Historical Provider, MD  Multiple Vitamin (MULTIVITAMIN) tablet Take 1 tablet by mouth daily.    Historical Provider, MD  sertraline (ZOLOFT) 50 MG tablet Take 50 mg by mouth daily.    Historical Provider, MD    Family History Family History  Problem Relation Age of Onset  . Colon cancer Neg Hx     Social History Social History  Substance Use Topics  . Smoking status: Never Smoker  .  Smokeless tobacco: Never Used  . Alcohol use 12.6 oz/week    21 Glasses of wine per week     Allergies   Patient has no known allergies.   Review of Systems Review of Systems  All other systems reviewed and are negative.    Physical Exam Updated Vital Signs BP 137/86 (BP Location: Left Arm)   Pulse 72   Temp 97.8 F (36.6 C) (Oral)   Resp 16   SpO2 97%   Physical Exam  Constitutional: She appears well-developed and well-nourished.  HENT:  Head: Normocephalic.  Eyes: Pupils are equal, round, and reactive to light.  Cardiovascular: Normal rate.   Tender left ribs,  Pain with breathing.  From shoulders,  Abdomen soft and nontender  Pulmonary/Chest: Effort normal.  Musculoskeletal: Normal range of motion.  Neurological: She is alert.  Skin: Skin is warm.  Psychiatric: She has a normal mood and affect.  Nursing note and vitals reviewed.    ED Treatments / Results  Labs (all labs ordered are listed, but only abnormal results are displayed) Labs Reviewed - No data to display  EKG  EKG Interpretation None       Radiology Dg Chest 2 View  Result Date: 04/02/2016 CLINICAL DATA:  Tripped over dog malacia and fell on the left side. Left chest pain.  EXAM: CHEST  2 VIEW COMPARISON:  None. FINDINGS: Heart size is normal. Mediastinal shadows are normal. The lungs are clear except for minimal linear atelectasis or scar at the left base. There is a fracture of the anterior left tenth rib. Ordinary mild degenerative changes affect the spine. IMPRESSION: Fracture of the anterior left tenth rib. No pneumothorax or hemothorax. Mild left base atelectasis or scar. Electronically Signed   By: Nelson Chimes M.D.   On: 04/02/2016 12:32    Procedures Procedures (including critical care time)  Medications Ordered in ED Medications - No data to display   Initial Impression / Assessment and Plan / ED Course  I have reviewed the triage vital signs and the nursing notes.  Pertinent  labs & imaging results that were available during my care of the patient were reviewed by me and considered in my medical decision making (see chart for details).  Clinical Course       Final Clinical Impressions(s) / ED Diagnoses   Final diagnoses:  Closed fracture of one rib of left side, initial encounter  Fall, initial encounter    New Prescriptions New Prescriptions   HYDROCODONE-ACETAMINOPHEN (NORCO/VICODIN) 5-325 MG TABLET    Take 2 tablets by mouth every 4 (four) hours as needed.     Fransico Meadow, PA-C 04/02/16 [place], MD 04/04/16 1024

## 2016-04-29 ENCOUNTER — Encounter: Payer: Self-pay | Admitting: Podiatry

## 2016-04-29 ENCOUNTER — Ambulatory Visit (INDEPENDENT_AMBULATORY_CARE_PROVIDER_SITE_OTHER): Admitting: Podiatry

## 2016-04-29 ENCOUNTER — Ambulatory Visit (INDEPENDENT_AMBULATORY_CARE_PROVIDER_SITE_OTHER)

## 2016-04-29 DIAGNOSIS — M722 Plantar fascial fibromatosis: Secondary | ICD-10-CM

## 2016-04-29 DIAGNOSIS — M79671 Pain in right foot: Secondary | ICD-10-CM | POA: Diagnosis not present

## 2016-04-29 MED ORDER — MELOXICAM 15 MG PO TABS: 15.0000 mg | ORAL_TABLET | Freq: Every day | ORAL | 0 refills | Status: DC

## 2016-04-29 MED ORDER — BETAMETHASONE SOD PHOS & ACET 6 (3-3) MG/ML IJ SUSP: 3.0000 mg | Freq: Once | INTRAMUSCULAR | Status: DC

## 2016-04-29 NOTE — Progress Notes (Signed)
Subjective: Patient presents today for pain and tenderness in the right foot. Patient states the foot pain has been hurting for several weeks now. Patient states that it hurts in the mornings with the first steps out of bed. Patient presents today for further treatment and evaluation  Objective: Physical Exam General: The patient is alert and oriented x3 in no acute distress.  Dermatology: Skin is warm, dry and supple bilateral lower extremities. Negative for open lesions or macerations bilateral.   Vascular: Dorsalis Pedis and Posterior Tibial pulses palpable bilateral.  Capillary fill time is immediate to all digits.  Neurological: Epicritic and protective threshold intact bilateral.   Musculoskeletal: Tenderness to palpation at the medial calcaneal tubercale and through the insertion of the plantar fascia of the right foot. All other joints range of motion within normal limits bilateral. Strength 5/5 in all groups bilateral.   Radiographic exam: Normal osseous mineralization. Joint spaces preserved. No fracture/dislocation/boney destruction. Calcaneal spur present with mild thickening of plantar fascia right. No other soft tissue abnormalities or radiopaque foreign bodies.   Assessment: 1. Plantar fasciitis right 2. Pain in right foot  Plan of Care:  1. Patient evaluated. Xrays reviewed.   2. Injection of 0.5cc Celestone soluspan injected into the right heel at the insertion of the plantar fascia.  3. Instructed patient regarding therapies and modalities at home to alleviate symptoms.  4. Rx for meloxicam dispensed   5. Plantar fascial band(s) dispensed. 6. Return to clinic in 4 weeks.

## 2016-05-08 ENCOUNTER — Telehealth: Payer: Self-pay | Admitting: *Deleted

## 2016-05-08 NOTE — Telephone Encounter (Addendum)
Pt states her insurance will not cover orthotics and she was wondering if Dr. Amalia Hailey liked the Dr. Felicie Morn orthotics. 05/08/2016-I informed pt that we did not advocate Dr. Felicie Morn, they were often flipsy and allowed the foot to roll to improper positioning, our office carried PowerStep and it provided firmer support. Pt states she may try to continue with the plantar fascial brace and gel heel cups, or come in and get PowerStep next week.

## 2016-05-27 ENCOUNTER — Ambulatory Visit (INDEPENDENT_AMBULATORY_CARE_PROVIDER_SITE_OTHER): Admitting: Podiatry

## 2016-05-27 DIAGNOSIS — M722 Plantar fascial fibromatosis: Secondary | ICD-10-CM

## 2016-05-27 DIAGNOSIS — M79671 Pain in right foot: Secondary | ICD-10-CM

## 2016-05-28 ENCOUNTER — Telehealth: Payer: Self-pay | Admitting: *Deleted

## 2016-05-28 DIAGNOSIS — R52 Pain, unspecified: Secondary | ICD-10-CM

## 2016-05-28 NOTE — Telephone Encounter (Addendum)
-----   Message from Edrick Kins, DPM sent at 05/27/2016  2:22 PM EST ----- Regarding: Refer to Orthopedic Please provide a patient referral to [facility].   Dx : Rib fracture #10 approximately 8 weeks ago. Residual pain.   Thanks, Dr. Amalia Hailey. 05/28/2016-Faxed referral, clinicals and demographics to [facility].05/29/2016-Pt states Dr.Evans was going to refer pt to orthopedist for fractured ribs. I told pt I was waiting for another part of Dr. Amalia Hailey dictation and I would fax to [facility]. I gave pt the [facility] [phone] and told her if they had not called her by 06/03/2016 afternoon to call them. 06/01/2016-Faxed referral, demographics and 04/29/2016 clinicals. 07/20/2016-Received fax requesting #90 Meloxicam. Dr. Trinidad Curet for #90 +1refill. Return fax to [facility] [phone].

## 2016-05-29 NOTE — Telephone Encounter (Signed)
Haha! You guys are the BEST! Thanks!

## 2016-06-07 MED ORDER — BETAMETHASONE SOD PHOS & ACET 6 (3-3) MG/ML IJ SUSP: 3.0000 mg | Freq: Once | INTRAMUSCULAR | Status: DC

## 2016-06-07 NOTE — Progress Notes (Signed)
Subjective: Patient presents today for follow-up evaluation of plantar fasciitis to the right foot. Patient states she is still in significant heel pain. This patient does not have alleviating symptoms. Patient is currently wearing crocs with inserts.  Objective: Physical Exam General: The patient is alert and oriented x3 in no acute distress.  Dermatology: Skin is warm, dry and supple bilateral lower extremities. Negative for open lesions or macerations bilateral.   Vascular: Dorsalis Pedis and Posterior Tibial pulses palpable bilateral.  Capillary fill time is immediate to all digits.  Neurological: Epicritic and protective threshold intact bilateral.   Musculoskeletal: Tenderness to palpation at the medial calcaneal tubercale and through the insertion of the plantar fascia of the right foot. All other joints range of motion within normal limits bilateral. Strength 5/5 in all groups bilateral.   Radiographic exam: Normal osseous mineralization. Joint spaces preserved. No fracture/dislocation/boney destruction. Calcaneal spur present with mild thickening of plantar fascia right. No other soft tissue abnormalities or radiopaque foreign bodies.   Assessment: 1. Plantar fasciitis right 2. Pain in right foot  Plan of Care:  1. Patient evaluated. Xrays reviewed.   2. Injection of 0.5cc Celestone soluspan injected into the right heel at the insertion of the plantar fascia.  3. Stressed the importance of stretching to alleviate plantar fascial symptoms  4. Continue meloxicam, continue plantar fascial bands 5. Today cam boot was dispensed 6. Return to clinic in 4 weeks 7. Referral for [facility] for evaluation of a left broken rib #10. DOI 8 weeks ago. Patient still has residual pain.

## 2016-06-15 ENCOUNTER — Other Ambulatory Visit: Payer: Self-pay | Admitting: *Deleted

## 2016-06-15 MED ORDER — MELOXICAM 15 MG PO TABS: 15.0000 mg | ORAL_TABLET | Freq: Every day | ORAL | 0 refills | Status: DC

## 2016-06-15 NOTE — Progress Notes (Unsigned)
Patient ID: Ana Graves, female   DOB: January 23, 1956, 61 y.o.   MRN: [id number]  Patient called 06/15/16 at 2:30 pm requesting a refill of Meloxicam from Dr Amalia Hailey to Ana Graves.  Approved patient notified Rx was sent in 5:59pm

## 2016-06-24 ENCOUNTER — Ambulatory Visit: Admitting: Podiatry

## 2016-07-15 ENCOUNTER — Ambulatory Visit (INDEPENDENT_AMBULATORY_CARE_PROVIDER_SITE_OTHER): Admitting: Podiatry

## 2016-07-15 DIAGNOSIS — M79671 Pain in right foot: Secondary | ICD-10-CM

## 2016-07-15 DIAGNOSIS — M722 Plantar fascial fibromatosis: Secondary | ICD-10-CM

## 2016-07-15 MED ORDER — BETAMETHASONE SOD PHOS & ACET 6 (3-3) MG/ML IJ SUSP: 3.0000 mg | Freq: Once | INTRAMUSCULAR | Status: DC

## 2016-07-15 MED ORDER — MELOXICAM 15 MG PO TABS: 15.0000 mg | ORAL_TABLET | Freq: Every day | ORAL | 1 refills | Status: AC

## 2016-07-15 NOTE — Progress Notes (Signed)
Subjective: Patient presents today for follow-up plantar fasciitis the right foot. Patient states he was doing very well however a few days ago she went walking all day in a different pair of boots and she reaggravated her plantar fasciitis to the right foot. Patient is going to [place] next week for 4 days.  Objective: Physical Exam General: The patient is alert and oriented x3 in no acute distress.  Dermatology: Skin is warm, dry and supple bilateral lower extremities. Negative for open lesions or macerations bilateral.   Vascular: Dorsalis Pedis and Posterior Tibial pulses palpable bilateral.  Capillary fill time is immediate to all digits.  Neurological: Epicritic and protective threshold intact bilateral.   Musculoskeletal: Tenderness to palpation at the medial calcaneal tubercale and through the insertion of the plantar fascia of the right foot. All other joints range of motion within normal limits bilateral. Strength 5/5 in all groups bilateral.   Assessment: 1. Plantar fasciitis right 2. Pain in right foot  Plan of Care:  1. Patient evaluated. Xrays reviewed.   2. Injection of 0.5cc Celestone soluspan injected into the right heel at the insertion of the plantar fascia.  3. Stressed the importance of stretching to alleviate plantar fascial symptoms  4. continue plantar fascial bands 5. Prescription for meloxicam 15 mg  6. Today we resubmitted a Referral for [facility] for evaluation of a left broken rib #10. DOI 12 weeks ago. Patient still has residual pain.  7. Return to clinic in 4 weeks

## 2016-07-16 ENCOUNTER — Telehealth: Payer: Self-pay | Admitting: *Deleted

## 2016-07-16 NOTE — Telephone Encounter (Addendum)
-----   Message from Edrick Kins, DPM sent at 07/15/2016  2:09 PM EST ----- Regarding: Refer to Orthopedics Please place a consult for an orthopedic referral.  Dx: left broken rib approximately 12 weeks ago.   Thanks, Dr. Amalia Hailey. 07/16/2016-I spoke with receptionist at [facility] and she states the referral of 06/01/2016 was not received. I reviewed there referral fax and it was confirmed received on 06/01/2016, I refaxed the referral. I informed pt the referral had been confirmed received by [facility] twice and she could call for an appt.

## 2016-08-12 ENCOUNTER — Ambulatory Visit (INDEPENDENT_AMBULATORY_CARE_PROVIDER_SITE_OTHER): Admitting: Podiatry

## 2016-08-12 DIAGNOSIS — M722 Plantar fascial fibromatosis: Secondary | ICD-10-CM

## 2016-08-12 DIAGNOSIS — M79671 Pain in right foot: Secondary | ICD-10-CM

## 2016-08-12 MED ORDER — METHYLPREDNISOLONE 4 MG PO TBPK: ORAL_TABLET | ORAL | 0 refills | Status: DC

## 2016-08-17 ENCOUNTER — Telehealth: Payer: Self-pay | Admitting: *Deleted

## 2016-08-17 DIAGNOSIS — M722 Plantar fascial fibromatosis: Secondary | ICD-10-CM

## 2016-08-17 NOTE — Telephone Encounter (Addendum)
-----   Message from Edrick Kins, DPM sent at 08/12/2016  1:36 PM EDT ----- Regarding: Physical Therapy Please order physical therapy.  3x/week x 4 weeks.   Dx: plantar fasciitis right foot.   Thanks, Dr. Amalia Hailey. 08/17/2016-Faxed required form and demographics to [facility].

## 2016-08-19 MED ORDER — BETAMETHASONE SOD PHOS & ACET 6 (3-3) MG/ML IJ SUSP: 3.0000 mg | Freq: Once | INTRAMUSCULAR | Status: DC

## 2016-08-19 NOTE — Progress Notes (Signed)
Subjective: Patient presents today for follow-up evaluation and treatment of plantar fasciitis to the right foot. Patient states that her foot still hurts. She is continuing stretches in the morning and experiences a warm sensation that turned into a warm pain to the right heel. Patient also states that she does have some pain and tenderness just above the ankle after periods of long walking.  Objective: Physical Exam General: The patient is alert and oriented x3 in no acute distress.  Dermatology: Skin is warm, dry and supple bilateral lower extremities. Negative for open lesions or macerations bilateral.   Vascular: Dorsalis Pedis and Posterior Tibial pulses palpable bilateral.  Capillary fill time is immediate to all digits.  Neurological: Epicritic and protective threshold intact bilateral.   Musculoskeletal: Tenderness to palpation at the medial calcaneal tubercale and through the insertion of the plantar fascia of the right foot. All other joints range of motion within normal limits bilateral. Strength 5/5 in all groups bilateral.   Assessment: 1. Plantar fasciitis right 2. Pain in right foot  Plan of Care:  1. Patient evaluated. Xrays reviewed.   2. Injection of 0.5cc Celestone soluspan injected into the right heel at the insertion of the plantar fascia.  3. Continue conservative treatment modalities. 4. Prescription for physical therapy 5. Prescription for Medrol Dosepak 6. Return to clinic in 4 weeks  Edrick Kins, DPM [facility]  Dr. Edrick Kins, [address]                                        [place], [place] [address]                Office [phone]  Fax [phone]

## 2016-09-09 ENCOUNTER — Ambulatory Visit: Admitting: Podiatry

## 2016-10-28 ENCOUNTER — Telehealth: Payer: Self-pay | Admitting: Podiatry

## 2016-10-28 NOTE — Telephone Encounter (Signed)
Pt called said you were to be referring her to someone for her broken rib a while ago and it never was sent. Can you resend it to the doctor you recommended previously.

## 2016-10-28 NOTE — Telephone Encounter (Signed)
Yes, please refer to [facility] for rib fracture.  Thanks, Dr. Amalia Hailey

## 2016-10-29 NOTE — Telephone Encounter (Addendum)
refaxed referral faxed to [facility] 05/2016. Left message informing pt I had faxed the referral 2 previous times with confirmation of receipt each time and that often I had to fax over and over until referrals went through to their office, and I left their appt [phone]. Pt confirmed she received [facility] phone number.

## 2017-02-17 ENCOUNTER — Encounter: Payer: Self-pay | Admitting: Podiatry

## 2017-02-17 ENCOUNTER — Ambulatory Visit (INDEPENDENT_AMBULATORY_CARE_PROVIDER_SITE_OTHER)

## 2017-02-17 ENCOUNTER — Ambulatory Visit (INDEPENDENT_AMBULATORY_CARE_PROVIDER_SITE_OTHER): Admitting: Podiatry

## 2017-02-17 DIAGNOSIS — S99912A Unspecified injury of left ankle, initial encounter: Secondary | ICD-10-CM | POA: Diagnosis not present

## 2017-02-18 ENCOUNTER — Telehealth: Payer: Self-pay | Admitting: Podiatry

## 2017-02-18 NOTE — Telephone Encounter (Signed)
Darci Current called the pt back about the insurance/billing part. Pt had a question about a sleeve and also wants everything ready for her to pick up tomorrow. Jocelyn Lamer stated she transferred the pt to either [phone] or [phone].

## 2017-02-18 NOTE — Telephone Encounter (Signed)
I saw Dr. Amalia Hailey yesterday. I was previously seen for my right heel and have a big boot for that. Yesterday I was seen for a sprained left ankle and I was given a tri-lock brace. I was wanting to know if I can get one for the right foot as well. I would like to get one for my right foot but my insurance rolls over to the new calendar year so I would like to get it tomorrow. Please call me at [phone] and let me know if it will be covered. Thank you.

## 2017-02-18 NOTE — Progress Notes (Signed)
   Subjective:  Patient presents today for pain and tenderness to the medial and lateral left ankle that began 2 days ago. She reports associated swelling and bruising. She states she twisted her ankle while in her garden. Patient relates significant pain and tenderness when walking. Patient presents for further treatment and evaluation.  Objective / Physical Exam:  General:  The patient is alert and oriented x3 in no acute distress. Dermatology:  Skin is warm, dry and supple bilateral lower extremities. Negative for open lesions or macerations. Vascular:  Palpable pedal pulses bilaterally. No edema or erythema noted. Capillary refill within normal limits. Neurological:  Epicritic and protective threshold grossly intact bilaterally.  Musculoskeletal Exam:  Pain on palpation to the anterior lateral medial aspects of the patient's left ankle. Mild edema noted.  Range of motion within normal limits to all pedal and ankle joints bilateral. Muscle strength 5/5 in all groups bilateral.   Radiographic Exam:  Normal osseous mineralization. Joint spaces preserved. No fracture/dislocation/boney destruction.    Assessment: #1 pain in left ankle #2 left ankle sprain  Plan of Care:  #1 Patient was evaluated. X-rays reviewed. #2 injection of 0.5 mL Celestone Soluspan injected in the patient's left ankle. #3 compression anklet dispensed. Weightbearing as tolerated. #4 recommended OTC Aleve when necessary. #5 patient is to return to clinic in 4 weeks   Edrick Kins, DPM [facility]  Dr. Edrick Kins, [address]                                        [place], [place] [address]                Office [phone]  Fax [phone]

## 2017-02-18 NOTE — Telephone Encounter (Signed)
LVM for patient to call

## 2017-02-19 ENCOUNTER — Telehealth: Payer: Self-pay | Admitting: Podiatry

## 2017-02-19 NOTE — Telephone Encounter (Signed)
I am returning [facility] call. I think everything is set and squared away, but if you need to talk to me, you can reach me at [phone]. I will be there around 10 am. Janett Billow doesn't need to call me back unless she needs to. Thank you.

## 2019-02-02 ENCOUNTER — Other Ambulatory Visit: Payer: Self-pay | Admitting: Obstetrics & Gynecology

## 2019-02-02 DIAGNOSIS — E2839 Other primary ovarian failure: Secondary | ICD-10-CM

## 2019-02-08 ENCOUNTER — Encounter: Payer: Self-pay | Admitting: Gastroenterology

## 2019-02-15 ENCOUNTER — Encounter: Payer: Self-pay | Admitting: Gastroenterology

## 2019-02-28 ENCOUNTER — Ambulatory Visit: Admitting: Gastroenterology

## 2019-04-05 ENCOUNTER — Ambulatory Visit (INDEPENDENT_AMBULATORY_CARE_PROVIDER_SITE_OTHER): Admitting: Gastroenterology

## 2019-04-05 ENCOUNTER — Encounter: Payer: Self-pay | Admitting: Gastroenterology

## 2019-04-05 ENCOUNTER — Other Ambulatory Visit: Payer: Self-pay

## 2019-04-05 ENCOUNTER — Telehealth: Payer: Self-pay | Admitting: Gastroenterology

## 2019-04-05 VITALS — BP 126/64 | HR 72 | Temp 98.5°F | Ht 64.0 in | Wt 139.0 lb

## 2019-04-05 DIAGNOSIS — Z86010 Personal history of colonic polyps: Secondary | ICD-10-CM

## 2019-04-05 DIAGNOSIS — Z1159 Encounter for screening for other viral diseases: Secondary | ICD-10-CM | POA: Diagnosis not present

## 2019-04-05 MED ORDER — SUPREP BOWEL PREP KIT 17.5-3.13-1.6 GM/177ML PO SOLN: 1.0000 | Freq: Once | ORAL | 0 refills | Status: AC

## 2019-04-05 NOTE — Progress Notes (Signed)
Ana Graves    [id number]    Sep 27, 1955  Primary Care Physician:Varadarajan, Ronie Spies, MD  Referring Physician: Marletta Lor, MD No address on file   Chief complaint:  H/o colon polyps  HPI: 63 year old female with history of hypertension here to discuss surveillance colonoscopy.  She was last seen October 2017. Patient feels terrible if she does not snack every 1-2 hours, gets very hungry.  She was told that she has hunger cramps when she becomes hypoglycemic about 20 years ago but no further testing was done at the time.  No history of any neuroendocrine tumors or glucagonoma. She tolerated bowel prep during last colonoscopy 3 years ago well.  No hypoglycemic episodes.  colonoscopy 03/20/2016: 4 sessile diminutive polyps [2 tubular adenomas 1 sessile serrated adenoma and hyperplastic polyp] and internal hemorrhoids.  Her father had diabetes and episodes of hypoglycemic episodes too.  Denies any nausea, vomiting, abdominal pain, melena or bright red blood per rectum  No family history of GI malignancy  Outpatient Encounter Medications as of 04/05/2019  Medication Sig  . Calcium Carbonate Antacid (TUMS E-X PO) Take by mouth daily.  . Multiple Vitamin (MULTIVITAMIN) tablet Take 1 tablet by mouth daily.  . sertraline (ZOLOFT) 50 MG tablet Take 50 mg by mouth daily.  . Sulfacetamide Sodium-Sulfur 10-5 % EMUL   . [DISCONTINUED] HYDROcodone-acetaminophen (NORCO/VICODIN) 5-325 MG tablet Take 2 tablets by mouth every 4 (four) hours as needed. (Patient not taking: Reported on 04/29/2016)  . [DISCONTINUED] methylPREDNISolone (MEDROL DOSEPAK) 4 MG TBPK tablet 6 day dose pack - take as directed   Facility-Administered Encounter Medications as of 04/05/2019  Medication  . 0.9 %  sodium chloride infusion  . betamethasone acetate-betamethasone sodium phosphate (CELESTONE) injection 3 mg  . betamethasone acetate-betamethasone sodium phosphate (CELESTONE)  injection 3 mg  . betamethasone acetate-betamethasone sodium phosphate (CELESTONE) injection 3 mg  . betamethasone acetate-betamethasone sodium phosphate (CELESTONE) injection 3 mg    Allergies as of 04/05/2019  . (No Known Allergies)    Past Medical History:  Diagnosis Date  . Blood transfusion without reported diagnosis 2007  . Cancer [facility]) 2012   skin cancer; face  . Hypoglycemia     Past Surgical History:  Procedure Laterality Date  . KNEE ARTHROSCOPY Left 1994  . MOHS SURGERY  2012  . OTHER SURGICAL HISTORY  1996   surgery for skin cancer  . TUBAL LIGATION  1995  . UTERINE FIBROID SURGERY  2004    Family History  Problem Relation Age of Onset  . Colon cancer Neg Hx     Social History   Socioeconomic History  . Marital status: Married    Spouse name: Not on file  . Number of children: Not on file  . Years of education: Not on file  . Highest education level: Not on file  Occupational History  . Not on file  Social Needs  . Financial resource strain: Not on file  . Food insecurity    Worry: Not on file    Inability: Not on file  . Transportation needs    Medical: Not on file    Non-medical: Not on file  Tobacco Use  . Smoking status: Never Smoker  . Smokeless tobacco: Never Used  Substance and Sexual Activity  . Alcohol use: Yes    Alcohol/week: 21.0 standard drinks    Types: 21 Glasses of wine per week  . Drug use: No  . Sexual activity: Not on  file  Lifestyle  . Physical activity    Days per week: Not on file    Minutes per session: Not on file  . Stress: Not on file  Relationships  . Social Herbalist on phone: Not on file    Gets together: Not on file    Attends religious service: Not on file    Active member of club or organization: Not on file    Attends meetings of clubs or organizations: Not on file    Relationship status: Not on file  . Intimate partner violence    Fear of current or ex partner: Not on file    Emotionally  abused: Not on file    Physically abused: Not on file    Forced sexual activity: Not on file  Other Topics Concern  . Not on file  Social History Narrative  . Not on file      Review of systems: Review of Systems  Constitutional: Negative for fever and chills.  HENT: Negative.   Eyes: Negative for blurred vision.  Respiratory: Negative for cough, shortness of breath and wheezing.   Cardiovascular: Negative for chest pain and palpitations.  Gastrointestinal: as per HPI Genitourinary: Negative for dysuria, urgency, frequency and hematuria.  Musculoskeletal: Negative for myalgias, back pain and joint pain.  Skin: Negative for itching and rash.  Neurological: Negative for dizziness, tremors, focal weakness, seizures and loss of consciousness.  Endo/Heme/Allergies: Negative Psychiatric/Behavioral: Negative for depression, suicidal ideas and hallucinations.  All other systems reviewed and are negative.   Physical Exam: Vitals:   04/05/19 1010  BP: 126/64  Pulse: 72  Temp: 98.5 F (36.9 C)   Body mass index is 23.86 kg/m. Gen:      No acute distress HEENT:  EOMI, sclera anicteric Neck:     No masses; no thyromegaly Lungs:    Clear to auscultation bilaterally; normal respiratory effort CV:         Regular rate and rhythm; no murmurs Abd:      + bowel sounds; soft, non-tender; no palpable masses, no distension Ext:    No edema; adequate peripheral perfusion Skin:      Warm and dry; no rash Neuro: alert and oriented x 3 Psych: normal mood and affect  Data Reviewed:  Reviewed labs, radiology imaging, old records and pertinent past GI work up   Assessment and Plan/Recommendations:  63 year old female with history of hypertension, adenomatous colon polyps here to discuss surveillance colonoscopy  Unclear if patient really has hypoglycemic episodes or not but she tolerated the bowel prep and the procedure 3 years ago without any difficulty We will schedule colonoscopy for  surveillance The risks and benefits as well as alternatives of endoscopic procedure(s) have been discussed and reviewed. All questions answered. The patient agrees to proceed. She can continue to drink clear liquids up to 2 hours prior to the procedure.  Advised patient to avoid any solid food the day before and day of procedure.  Return as needed    K. Denzil Magnuson , MD    CC: Marletta Lor, MD

## 2019-04-05 NOTE — Patient Instructions (Signed)
You have been scheduled for a colonoscopy. Please follow written instructions given to you at your visit today.  Please pick up your prep supplies at the pharmacy within the next 1-3 days. If you use inhalers (even only as needed), please bring them with you on the day of your procedure.   If you are age 64 or older, your body mass index should be between 23-30. Your Body mass index is 23.86 kg/m. If this is out of the aforementioned range listed, please consider follow up with your Primary Care Provider.  If you are age 107 or younger, your body mass index should be between 19-25. Your Body mass index is 23.86 kg/m. If this is out of the aformentioned range listed, please consider follow up with your Primary Care Provider.    I appreciate the  opportunity to care for you  Thank You   Harl Bowie , MD

## 2019-04-05 NOTE — Telephone Encounter (Signed)
Patient got prep called in today at [facility] on [address] but they no longer use that one. She was able to get the prep but wants to update the pharmacy on her file. The new [facility] is [facility] [address].

## 2019-04-12 NOTE — Telephone Encounter (Signed)
Pharmacy updated.

## 2019-04-13 ENCOUNTER — Ambulatory Visit
Admission: RE | Admit: 2019-04-13 | Discharge: 2019-04-13 | Disposition: A | Discharge status: Home / Self Care | Source: Ambulatory Visit | Attending: Obstetrics & Gynecology | Admitting: Obstetrics & Gynecology

## 2019-04-13 ENCOUNTER — Other Ambulatory Visit: Payer: Self-pay

## 2019-04-13 DIAGNOSIS — E2839 Other primary ovarian failure: Secondary | ICD-10-CM

## 2019-05-01 ENCOUNTER — Other Ambulatory Visit: Payer: Self-pay | Admitting: Gastroenterology

## 2019-05-01 ENCOUNTER — Ambulatory Visit (INDEPENDENT_AMBULATORY_CARE_PROVIDER_SITE_OTHER)

## 2019-05-01 DIAGNOSIS — Z1159 Encounter for screening for other viral diseases: Secondary | ICD-10-CM

## 2019-05-01 LAB — SARS CORONAVIRUS 2 (TAT 6-24 HRS): SARS Coronavirus 2: NEGATIVE

## 2019-05-03 ENCOUNTER — Other Ambulatory Visit: Payer: Self-pay

## 2019-05-03 ENCOUNTER — Encounter: Payer: Self-pay | Admitting: Gastroenterology

## 2019-05-03 ENCOUNTER — Ambulatory Visit (AMBULATORY_SURGERY_CENTER): Admitting: Gastroenterology

## 2019-05-03 VITALS — BP 133/81 | HR 72 | Temp 98.5°F | Resp 16 | Ht 64.0 in | Wt 139.0 lb

## 2019-05-03 DIAGNOSIS — Z86010 Personal history of colonic polyps: Secondary | ICD-10-CM

## 2019-05-03 DIAGNOSIS — D125 Benign neoplasm of sigmoid colon: Secondary | ICD-10-CM

## 2019-05-03 DIAGNOSIS — D122 Benign neoplasm of ascending colon: Secondary | ICD-10-CM

## 2019-05-03 DIAGNOSIS — D123 Benign neoplasm of transverse colon: Secondary | ICD-10-CM

## 2019-05-03 MED ORDER — SODIUM CHLORIDE 0.9 % IV SOLN: 500.0000 mL | Freq: Once | INTRAVENOUS | Status: DC

## 2019-05-03 NOTE — Progress Notes (Signed)
PT taken to PACU. Monitors in place. VSS. Report given to RN. 

## 2019-05-03 NOTE — Progress Notes (Signed)
Called to room to assist during endoscopic procedure.  Patient ID and intended procedure confirmed with present staff. Received instructions for my participation in the procedure from the performing physician.  

## 2019-05-03 NOTE — Progress Notes (Signed)
VS-DT Temp-JB 

## 2019-05-03 NOTE — Patient Instructions (Signed)
Handout on polyps given   YOU HAD AN ENDOSCOPIC PROCEDURE TODAY AT THE [facility]:   Refer to the procedure report that was given to you for any specific questions about what was found during the examination.  If the procedure report does not answer your questions, please call your gastroenterologist to clarify.  If you requested that your care partner not be given the details of your procedure findings, then the procedure report has been included in a sealed envelope for you to review at your convenience later.  YOU SHOULD EXPECT: Some feelings of bloating in the abdomen. Passage of more gas than usual.  Walking can help get rid of the air that was put into your GI tract during the procedure and reduce the bloating. If you had a lower endoscopy (such as a colonoscopy or flexible sigmoidoscopy) you may notice spotting of blood in your stool or on the toilet paper. If you underwent a bowel prep for your procedure, you may not have a normal bowel movement for a few days.  Please Note:  You might notice some irritation and congestion in your nose or some drainage.  This is from the oxygen used during your procedure.  There is no need for concern and it should clear up in a day or so.  SYMPTOMS TO REPORT IMMEDIATELY:   Following lower endoscopy (colonoscopy or flexible sigmoidoscopy):  Excessive amounts of blood in the stool  Significant tenderness or worsening of abdominal pains  Swelling of the abdomen that is new, acute  Fever of 100F or higher   For urgent or emergent issues, a gastroenterologist can be reached at any hour by calling [phone].   DIET:  We do recommend a small meal at first, but then you may proceed to your regular diet.  Drink plenty of fluids but you should avoid alcoholic beverages for 24 hours.  ACTIVITY:  You should plan to take it easy for the rest of today and you should NOT DRIVE or use heavy machinery until tomorrow (because of the sedation  medicines used during the test).    FOLLOW UP: Our staff will call the number listed on your records 48-72 hours following your procedure to check on you and address any questions or concerns that you may have regarding the information given to you following your procedure. If we do not reach you, we will leave a message.  We will attempt to reach you two times.  During this call, we will ask if you have developed any symptoms of COVID 19. If you develop any symptoms (ie: fever, flu-like symptoms, shortness of breath, cough etc.) before then, please call [phone].  If you test positive for Covid 19 in the 2 weeks post procedure, please call and report this information to us.    If any biopsies were taken you will be contacted by phone or by letter within the next 1-3 weeks.  Please call us at [phone] if you have not heard about the biopsies in 3 weeks.    SIGNATURES/CONFIDENTIALITY: You and/or your care partner have signed paperwork which will be entered into your electronic medical record.  These signatures attest to the fact that that the information above on your After Visit Summary has been reviewed and is understood.  Full responsibility of the confidentiality of this discharge information lies with you and/or your care-partner. 

## 2019-05-03 NOTE — Op Note (Signed)
[facility] Patient Name: Ana Graves Procedure Date: 05/03/2019 9:42 AM MRN: [id number] Endoscopist: Mauri Pole , MD Age: 63 Referring MD:  Date of Birth: 12-21-1955 Gender: Female Account #: [id number] Procedure:                Colonoscopy Indications:              Screening for colorectal malignant neoplasm Medicines:                Monitored Anesthesia Care Procedure:                Pre-Anesthesia Assessment:                           - Prior to the procedure, a History and Physical                            was performed, and patient medications and                            allergies were reviewed. The patient's tolerance of                            previous anesthesia was also reviewed. The risks                            and benefits of the procedure and the sedation                            options and risks were discussed with the patient.                            All questions were answered, and informed consent                            was obtained. Prior Anticoagulants: The patient has                            taken no previous anticoagulant or antiplatelet                            agents. ASA Grade Assessment: II - A patient with                            mild systemic disease. After reviewing the risks                            and benefits, the patient was deemed in                            satisfactory condition to undergo the procedure.                           After obtaining informed consent, the colonoscope  was passed under direct vision. Throughout the                            procedure, the patient's blood pressure, pulse, and                            oxygen saturations were monitored continuously. The                            Colonoscope was introduced through the anus and                            advanced to the the cecum, identified by                            appendiceal  orifice and ileocecal valve. The                            colonoscopy was performed without difficulty. The                            patient tolerated the procedure well. The quality                            of the bowel preparation was good. The ileocecal                            valve, appendiceal orifice, and rectum were                            photographed. Scope In: 9:50:23 AM Scope Out: 10:05:12 AM Scope Withdrawal Time: 0 hours 10 minutes 40 seconds  Total Procedure Duration: 0 hours 14 minutes 49 seconds  Findings:                 The perianal and digital rectal examinations were                            normal.                           Two sessile polyps were found in the sigmoid colon                            and transverse colon. The polyps were 1 to 2 mm in                            size. These polyps were removed with a cold biopsy                            forceps. Resection and retrieval were complete.                           A 4 mm polyp was found in the ascending colon. The  polyp was sessile. The polyp was removed with a                            cold snare. Resection and retrieval were complete.                           Non-bleeding internal hemorrhoids were found during                            retroflexion. The hemorrhoids were small.                           The exam was otherwise without abnormality. Complications:            No immediate complications. Estimated Blood Loss:     Estimated blood loss was minimal. Impression:               - Two 1 to 2 mm polyps in the sigmoid colon and in                            the transverse colon, removed with a cold biopsy                            forceps. Resected and retrieved.                           - One 4 mm polyp in the ascending colon, removed                            with a cold snare. Resected and retrieved.                           - Non-bleeding internal  hemorrhoids.                           - The examination was otherwise normal. Recommendation:           - Patient has a contact number available for                            emergencies. The signs and symptoms of potential                            delayed complications were discussed with the                            patient. Return to normal activities tomorrow.                            Written discharge instructions were provided to the                            patient.                           -  Resume previous diet.                           - Continue present medications.                           - Await pathology results.                           - Repeat colonoscopy in 3 - 5 years for                            surveillance based on pathology results. Mauri Pole, MD 05/03/2019 10:12:43 AM This report has been signed electronically.

## 2019-05-05 ENCOUNTER — Telehealth: Payer: Self-pay

## 2019-05-05 ENCOUNTER — Encounter: Payer: Self-pay | Admitting: Gastroenterology

## 2019-05-05 NOTE — Telephone Encounter (Signed)
Covid-19 screening questions   Do you now or have you had a fever in the last 14 days? No.  Do you have any respiratory symptoms of shortness of breath or cough now or in the last 14 days? No  Do you have any family members or close contacts with diagnosed or suspected Covid-19 in the past 14 days? No. Have you been tested for Covid-19 and found to be positive? No.       Follow up Call-  Call back number 05/03/2019  Post procedure Call Back phone  # [phone]  Permission to leave phone message Yes  Some recent data might be hidden     Patient questions:  Do you have a fever, pain , or abdominal swelling? No. Pain Score  0 *  Have you tolerated food without any problems? Yes.    Have you been able to return to your normal activities? Yes.    Do you have any questions about your discharge instructions: Diet   No. Medications  No. Follow up visit  No.  Do you have questions or concerns about your Care? No.  Actions: * If pain score is 4 or above: No action needed, pain <4.

## 2021-10-30 ENCOUNTER — Encounter: Payer: Self-pay | Admitting: Internal Medicine

## 2021-11-19 ENCOUNTER — Encounter: Payer: Self-pay | Admitting: Internal Medicine

## 2021-12-26 ENCOUNTER — Encounter: Payer: Self-pay | Admitting: *Deleted

## 2022-01-04 NOTE — Progress Notes (Addendum)
01/04/2022 Ana Graves [id number] [birth year]   Chief Complaint: Nausea, vomiting  History of Present Illness: Ana Graves is a 66 year old female with a past medical history of hypertension, rheumatoid arthritis on Humira, alcohol use disorder and colon polyps. She presents today as referred by Dr. Leeroy Cha for further evaluation regardign elevated LFTs. She is followed by Dr. Silverio Decamp.  She complains of vomiting easily when she brushes her teeth for the past 15 years.  However, over the past year, she vomits daily.  She awakens in the morning and sometimes vomits after she drinks a few sips of water.  She also vomits up partially digested food at least once daily.  No hematemesis.  No specific food triggers.  No associated upper or lower abdominal pain.  She has been on numerous courses of antibiotics due to having a dermatological disorder including rosacea.  One year ago, she was prescribed Doxycycline which she took for almost 2 months which resulted in nausea, vomiting and a hairy black tongue.Marland Kitchen  She was recently seen by a new dermatologist and she was prescribed Bactrim DS once daily for 1 month followed by another 2-week course as her rosacea flared again.  She has lost 8 pounds over the past year.  She experienced some sweats earlier this warning which is atypical.  No fevers.  No dysphagia or heartburn.  She typically passes a normal formed brown bowel movement most days without rectal bleeding or black stools.  She has intermittent loose stools.  Sometimes she vomits when she is actively passing a bowel movement.  Her most recent colonoscopy 05/03/2019 identified 3 tubular adenomatous polyps removed from the colon.  She is due for repeat colonoscopy 04/2022.  She drinks "a lot of wine and spritzer's daily" for the past 10 years.  She did not wish to disclose exactly how much she drinks daily but acknowledged it was a lot of alcohol.  She does not think she can  stop drinking alcohol on her own.  Her husband is supportive.  She has rheumatoid arthritis on Humira followed by Rheumatologist Leigh Aurora. Labs done at the rheumatology clinic 01/04/2022 showed the following: QuantiFERON gold negative.  Hepatitis A IgM negative.  Hepatitis B surface antigen negative.  Hepatitis B core IgM antibody negative.  Hepatitis C antibody < 0.1.  WBC 6.1.  Hemoglobin 13.1.  Hematocrit 38.9.  Platelet 184.  Glucose 87.  BUN 5.  Creatinine 0.70.  Sodium 137.  Potassium 3.9.  Calcium 9.3.  Albumin 4.6.  Total bili 0.5.  Alk phos 67.  AST 158.  ALT 60.  CK 87.  PAST GI PROCEDURES:  Colonoscopy 03/20/2016:  - 4 sessile diminutive polyps [2 tubular adenomas 1 sessile serrated adenoma and hyperplastic polyp]  - Internal hemorrhoids  Colonoscopy 05/03/2019: - Two 1 to 2 mm polyps in the sigmoid colon and in the transverse colon, removed with a cold biopsy forceps. Resected and retrieved. - One 4 mm polyp in the ascending colon, removed with a cold snare. Resected and retrieved. - Non-bleeding internal hemorrhoids. - The examination was otherwise normal. - Recall colonoscopy 3 years TUBULAR ADENOMA (X2 FRAGMENTS). - NO HIGH GRADE DYSPLASIA OR MALIGNANCY  Current Outpatient Medications on File Prior to Visit  Medication Sig Dispense Refill   augmented betamethasone dipropionate (DIPROLENE-AF) 0.05 % cream Apply topically.     Calcium Carbonate Antacid (TUMS E-X PO) Take by mouth daily.     clindamycin (CLEOCIN T) 1 % lotion Apply 1 Application topically  2 (two) times daily.     metroNIDAZOLE (METROCREAM) 0.75 % cream Apply 1 Application topically 2 (two) times daily.     Multiple Vitamin (MULTIVITAMIN) tablet Take 1 tablet by mouth daily.     sertraline (ZOLOFT) 50 MG tablet Take 50 mg by mouth daily.     Sulfacetamide Sodium-Sulfur 10-5 % EMUL      sulfamethoxazole-trimethoprim (BACTRIM DS) 800-160 MG tablet Take 1 tablet by mouth daily.     No current  facility-administered medications on file prior to visit.   No Known Allergies  Current Medications, Allergies, Past Medical History, Past Surgical History, Family History and Social History were reviewed in [employer] record.  Review of Systems:   Constitutional: See HPI. Respiratory: Negative for shortness of breath.   Cardiovascular: Negative for chest pain, palpitations and leg swelling.  Gastrointestinal: See HPI.  Musculoskeletal: Negative for back pain or muscle aches.  Neurological: Negative for dizziness, headaches or paresthesias.   Physical Exam: BP 130/84   Pulse 93   Ht 5' 4.5" (1.638 m)   Wt 126 lb (57.2 kg)   SpO2 97%   BMI 21.29 kg/m   Wt Readings from Last 3 Encounters:  01/05/22 126 lb (57.2 kg)  05/03/19 139 lb (63 kg)  04/05/19 139 lb (63 kg)    General: Pleasant 66 year old female in no acute distress. Head: Normocephalic and atraumatic. Eyes: No scleral icterus. Conjunctiva pink . Ears: Normal auditory acuity. Mouth: Dentition intact. No ulcers or lesions.  Lungs: Clear throughout to auscultation. Heart: Regular rate and rhythm, no murmur. Abdomen: Soft, nontender and nondistended. No masses or hepatomegaly. Normal bowel sounds x 4 quadrants.  Rectal: Deferred. Musculoskeletal: Symmetrical with no gross deformities. Extremities: No edema. Neurological: Alert oriented x 4. No focal deficits.  Psychological: Alert and cooperative. Normal mood and affect Skin: Facial rosacea.  Assessment and Recommendations:  24) 66 year old female with chronic nausea and vomiting in setting of easy gag reflex, multiple antibiotic use secondary to a dermatological disorder and daily alcohol use.  -Famotidine 20 mg once daily, may increase to twice daily if needed -Ondansetron 4 mg ODT 1 tab every 6 hours as needed -EGD benefits and risks discussed including risk with sedation, risk of bleeding, perforation and infection  -Abdominal sonogram   -Avoid spicy/greasy foods -See plan in #2  2) Alcohol use disorder. Elevated LFTs. AST/ALT ratio consistent with alcohol use.  -Patient encouraged to reduce alcohol intake with eventual goal of complete alcohol cessation.  I recommended inpatient versus outpatient rehab, patient to check with insurance carrier regarding coverage for rehab -Recommended obtaining a therapist for support -I discussed my concern regarding potential adverse reactions to medications i.e. antibiotics and Humira in the setting of heavy alcohol use -Recommend checking ANA, SMA, AMA, IgG, alpha-1 antitrypsin, ceruloplasmin, ferritin and iron level next lab draw   3) History of tubular adenomatous colon polyps  4) Brown coating to the posterior tongue, likely due to altered oral  -Consider probiotic of choice once daily -Avoid aggressive brushing of tongue  5) Rheumatoid arthritis on Humira  6) Rosacea vs other dermatological disorder, previously treated with Doxycycline, currently on Bactrim per dermatology  Further recommendations to be determined after the above evaluation completed  ADDENDUM: I received a letter from Bebe Shaggy 01/07/2022 which provided additional symptom history.  She denied having any abdominal pain at the time of her office visit, however, she describes having epigastric pain which occurs most mornings when she awakens which feels like horrible hunger  pain which last for about 1 hour.    A list of her dermatological treatment/antibiotics was also provided.  A copy of her letter and medication list to be scanned into her Epic chart.   Patient to proceed with an upper endoscopy and abdominal sonogram as ordered above.

## 2022-01-05 ENCOUNTER — Encounter: Payer: Self-pay | Admitting: Nurse Practitioner

## 2022-01-05 ENCOUNTER — Ambulatory Visit (INDEPENDENT_AMBULATORY_CARE_PROVIDER_SITE_OTHER): Payer: Medicare Other | Admitting: Nurse Practitioner

## 2022-01-05 VITALS — BP 130/84 | HR 93 | Ht 64.5 in | Wt 126.0 lb

## 2022-01-05 DIAGNOSIS — R112 Nausea with vomiting, unspecified: Secondary | ICD-10-CM

## 2022-01-05 DIAGNOSIS — R7989 Other specified abnormal findings of blood chemistry: Secondary | ICD-10-CM | POA: Diagnosis not present

## 2022-01-05 MED ORDER — FAMOTIDINE 20 MG PO TABS: 20.0000 mg | ORAL_TABLET | Freq: Two times a day (BID) | ORAL | 3 refills | Status: DC

## 2022-01-05 MED ORDER — ONDANSETRON 4 MG PO TBDP: 4.0000 mg | ORAL_TABLET | Freq: Three times a day (TID) | ORAL | 3 refills | Status: DC | PRN

## 2022-01-05 NOTE — Patient Instructions (Addendum)
_______________________________________________________  If you are age 66 or older, your body mass index should be between 23-30. Your Body mass index is 21.29 kg/m. If this is out of the aforementioned range listed, please consider follow up with your Primary Care Provider.  If you are age 35 or younger, your body mass index should be between 19-25. Your Body mass index is 21.29 kg/m. If this is out of the aformentioned range listed, please consider follow up with your Primary Care Provider.   ________________________________________________________  The [facility] providers would like to encourage you to use [facility] to communicate with providers for non-urgent requests or questions.  Due to long hold times on the telephone, sending your provider a message by [facility] may be a faster and more efficient way to get a response.  Please allow 48 business hours for a response.  Please remember that this is for non-urgent requests.  _______________________________________________________   1) Pepcid '20mg'$  one tab by mouth twice daily, purchased over the counter   2) Ondansetron '4mg'$  tab dissolve on tongue every 8 hours as needed for nausea and vomiting   3) Encourage reduce alcohol intake with eventual goal being complete alcohol cessation. Recommend alcohol rehab.   You will be contacted by [place] in the next 2 days to arrange a RUQ ultrasound.  The number on your caller ID will be [phone], please answer when they call.  If you have not heard from them in 2 days please call [phone] to schedule.     Due to recent changes in healthcare laws, you may see the results of your imaging and laboratory studies on MyChart before your provider has had a chance to review them.  We understand that in some cases there may be results that are confusing or concerning to you. Not all laboratory results come back in the same time frame and the provider may be waiting for multiple  results in order to interpret others.  Please give [place] 48 hours in order for your provider to thoroughly review all the results before contacting the office for clarification of your results.   It was a pleasure to see you today!  Thank you for trusting me with your gastrointestinal care!

## 2022-01-15 ENCOUNTER — Ambulatory Visit (HOSPITAL_COMMUNITY)
Admission: RE | Admit: 2022-01-15 | Discharge: 2022-01-15 | Disposition: A | Discharge status: Home / Self Care | Payer: Medicare Other | Source: Ambulatory Visit | Attending: Nurse Practitioner | Admitting: Nurse Practitioner

## 2022-01-15 DIAGNOSIS — R7989 Other specified abnormal findings of blood chemistry: Secondary | ICD-10-CM | POA: Diagnosis present

## 2022-01-15 DIAGNOSIS — R112 Nausea with vomiting, unspecified: Secondary | ICD-10-CM | POA: Diagnosis present

## 2022-02-02 ENCOUNTER — Telehealth: Payer: Self-pay | Admitting: Nurse Practitioner

## 2022-02-02 NOTE — Telephone Encounter (Signed)
Spoke with the patient. Agrees to rescheduling her appointment to 02/20/22 arrive at 8:30. Declines new instructions. Understands NPO after 6:30 am. The new procedure time is 30 minutes earlier than previously scheduled. Questions invited and answered.

## 2022-02-02 NOTE — Telephone Encounter (Signed)
Inbound call from patient stating that 10 days ago tested positive for COVID and has been taking medication and still coughs at night. Patient is scheduled to have a colonoscopy with Dr. Silverio Decamp on Friday 9/15 and is seeking advice if she needs to reschedule. Please advise.

## 2022-02-06 ENCOUNTER — Encounter: Payer: Medicare Other | Admitting: Gastroenterology

## 2022-02-20 ENCOUNTER — Ambulatory Visit (AMBULATORY_SURGERY_CENTER): Payer: Medicare Other | Admitting: Gastroenterology

## 2022-02-20 ENCOUNTER — Encounter: Payer: Self-pay | Admitting: Gastroenterology

## 2022-02-20 VITALS — BP 113/68 | HR 81 | Temp 99.6°F | Resp 11 | Ht 64.0 in | Wt 126.0 lb

## 2022-02-20 DIAGNOSIS — R112 Nausea with vomiting, unspecified: Secondary | ICD-10-CM

## 2022-02-20 DIAGNOSIS — K222 Esophageal obstruction: Secondary | ICD-10-CM

## 2022-02-20 MED ORDER — SODIUM CHLORIDE 0.9 % IV SOLN: 500.0000 mL | Freq: Once | INTRAVENOUS | Status: DC

## 2022-02-20 NOTE — Progress Notes (Signed)
Pt awake, alert and oriented. VSS. Airway intact. SBAR complete to RN. All questions answered.  

## 2022-02-20 NOTE — Progress Notes (Signed)
1005 - airway obstruction noted. O2 sat did not recover in timely manner with +ETCO2 Bag mask Ventilation intiated with positive pressure ventilation. +ETCO2 noted. Procedure not continued

## 2022-02-20 NOTE — Progress Notes (Signed)
1020 - PT with productive cough. VSS. Airway intact on room air. Pt awake and answering questions. Pt appropriate for transfer to Post-op/recovery area.

## 2022-02-20 NOTE — Progress Notes (Signed)
[facility] History and Physical   Primary Care Physician:  Leeroy Cha, MD   Reason for Procedure:  Persistent nausea and vomiting  Plan:    EGD with possible interventions as needed     HPI: Ana Graves is a very pleasant 66 y.o. female here for EGD for evaluation of persistent unexplained nausea and vomiting.  Please refer to office visit note 01/05/22 by Carl Best for additional details  The risks and benefits as well as alternatives of endoscopic procedure(s) have been discussed and reviewed. All questions answered. The patient agrees to proceed.    Past Medical History:  Diagnosis Date   Anxiety    Arthritis    Rhuematoid arthritis   Blood transfusion without reported diagnosis 2007   Cancer [facility]) 2012   skin cancer; face   Elevated LFTs    Hemorrhoids    History of colon polyps    Hypoglycemia    Rosacea     Past Surgical History:  Procedure Laterality Date   KNEE ARTHROSCOPY Left 1994   MOHS SURGERY  2012   [place]   surgery for skin cancer   Ana Graves  2004    Prior to Admission medications   Medication Sig Start Date End Date Taking? Authorizing Provider  Multiple Vitamin (MULTIVITAMIN) tablet Take 1 tablet by mouth daily.   Yes [provider]  sertraline (ZOLOFT) 50 MG tablet Take 50 mg by mouth daily.   Yes [provider]  augmented betamethasone dipropionate (DIPROLENE-AF) 0.05 % cream Apply topically. 12/30/21   [provider]  Calcium Carbonate Antacid (TUMS E-X PO) Take by mouth daily.    [provider]  clindamycin (CLEOCIN T) 1 % lotion Apply 1 Application topically 2 (two) times daily. 10/03/21   [provider]  famotidine (PEPCID) 20 MG tablet Take 1 tablet (20 mg total) by mouth 2 (two) times daily. Patient not taking: Reported on 02/20/2022 01/05/22   Noralyn Pick, NP  HUMIRA PEN 40  MG/0.4ML PNKT SMARTSIG:40 Milligram(s) SUB-Q Every 2 Weeks 02/02/22   [provider]  metroNIDAZOLE (METROCREAM) 0.75 % cream Apply 1 Application topically 2 (two) times daily. 10/03/21   [provider]  ondansetron (ZOFRAN-ODT) 4 MG disintegrating tablet Take 1 tablet (4 mg total) by mouth every 8 (eight) hours as needed for nausea or vomiting. 01/05/22   Noralyn Pick, NP  Sulfacetamide Sodium-Sulfur 10-5 % EMUL  03/31/16   [provider]  sulfamethoxazole-trimethoprim (BACTRIM DS) 800-160 MG tablet Take 1 tablet by mouth daily. 12/30/21   [provider]    Current Outpatient Medications  Medication Sig Dispense Refill   Multiple Vitamin (MULTIVITAMIN) tablet Take 1 tablet by mouth daily.     sertraline (ZOLOFT) 50 MG tablet Take 50 mg by mouth daily.     augmented betamethasone dipropionate (DIPROLENE-AF) 0.05 % cream Apply topically.     Calcium Carbonate Antacid (TUMS E-X PO) Take by mouth daily.     clindamycin (CLEOCIN T) 1 % lotion Apply 1 Application topically 2 (two) times daily.     famotidine (PEPCID) 20 MG tablet Take 1 tablet (20 mg total) by mouth 2 (two) times daily. (Patient not taking: Reported on 02/20/2022) 60 tablet 3   HUMIRA PEN 40 MG/0.4ML PNKT SMARTSIG:40 Milligram(s) SUB-Q Every 2 Weeks     metroNIDAZOLE (METROCREAM) 0.75 % cream Apply 1 Application topically 2 (two) times daily.     ondansetron (  ZOFRAN-ODT) 4 MG disintegrating tablet Take 1 tablet (4 mg total) by mouth every 8 (eight) hours as needed for nausea or vomiting. 30 tablet 3   Sulfacetamide Sodium-Sulfur 10-5 % EMUL      sulfamethoxazole-trimethoprim (BACTRIM DS) 800-160 MG tablet Take 1 tablet by mouth daily.     Current Facility-Administered Medications  Medication Dose Route Frequency Provider Last Rate Last Admin   0.9 %  sodium chloride infusion  500 mL Intravenous Once Mauri Pole, MD        Allergies as of 02/20/2022   (No Known Allergies)     Family History  Problem Relation Age of Onset   Stomach cancer Mother    Rectal cancer Sister    Colon cancer Neg Hx    Esophageal cancer Neg Hx     Social History   Socioeconomic History   Marital status: Married    Spouse name: Not on file   Number of children: Not on file   Years of education: Not on file   Highest education level: Not on file  Occupational History   Not on file  Tobacco Use   Smoking status: Never   Smokeless tobacco: Never  Vaping Use   Vaping Use: Never used  Substance and Sexual Activity   Alcohol use: Yes    Alcohol/week: 21.0 standard drinks of alcohol    Types: 21 Glasses of wine per week   Drug use: No   Sexual activity: Not on file  Other Topics Concern   Not on file  Social History Narrative   Not on file   Social Determinants of Health   Financial Resource Strain: Not on file  Food Insecurity: Not on file  Transportation Needs: Not on file  Physical Activity: Not on file  Stress: Not on file  Social Connections: Not on file  Intimate Partner Violence: Not on file    Review of Systems:  All other review of systems negative except as mentioned in the HPI.  Physical Exam: Vital signs in last 24 hours: Blood Pressure 113/68   Pulse 85   Temperature 99.6 F (37.6 C) (Temporal)   Height '5\' 4"'$  (1.626 m)   Weight 126 lb (57.2 kg)   Oxygen Saturation 98%   Body Mass Index 21.63 kg/m  General:   Alert, NAD Lungs:  Clear .   Heart:  Regular rate and rhythm Abdomen:  Soft, nontender and nondistended. Neuro/Psych:  Alert and cooperative. Normal mood and affect. A and O x 3  Reviewed labs, radiology imaging, old records and pertinent past GI work up  Patient is appropriate for planned procedure(s) and anesthesia in an ambulatory setting   K. Denzil Magnuson , MD [phone]

## 2022-02-20 NOTE — Progress Notes (Signed)
1012 O2 Sats recovered to greater than 90% following bag mask ventilation with 100% FiO2. Facemask applied at [place]. Pt with productive cough. Clear secretions suctions from oropharnyx. VSS.

## 2022-02-20 NOTE — Op Note (Signed)
[place] Patient Name: Ana Graves Procedure Date: 02/20/2022 9:45 AM MRN: [id number] Endoscopist: Mauri Pole , MD Age: 66 Referring MD:  Date of Birth: 1956-01-18 Gender: Female Account #: [id number] Procedure:                Upper GI endoscopy Indications:              Persistent nausea and vomiting of unknown cause,                            Suspected esophageal reflux Medicines:                Propofol per Anesthesia, Monitored Anesthesia Care Procedure:                Pre-Anesthesia Assessment:                           - Prior to the procedure, a History and Physical                            was performed, and patient medications and                            allergies were reviewed. The patient's tolerance of                            previous anesthesia was also reviewed. The risks                            and benefits of the procedure and the sedation                            options and risks were discussed with the patient.                            All questions were answered, and informed consent                            was obtained. Prior Anticoagulants: The patient has                            taken no previous anticoagulant or antiplatelet                            agents. ASA Grade Assessment: III - A patient with                            severe systemic disease. After reviewing the risks                            and benefits, the patient was deemed in                            satisfactory condition to undergo the procedure.  After obtaining informed consent, the endoscope was                            passed under direct vision. Throughout the                            procedure, the patient's blood pressure, pulse, and                            oxygen saturations were monitored continuously. The                            Endoscope was introduced through the mouth, with                             the intention of advancing to the duodenum. The                            scope was advanced to the cricopharyngeal esophagus                            before the procedure was aborted. Medications were                            given. The upper GI endoscopy was performed with                            difficulty due to stenosis. The patient tolerated                            the procedure well. Scope In: Scope Out: Findings:                 One benign-appearing, intrinsic severe (stenosis;                            an endoscope cannot pass) stenosis was found 15 to                            16 cm from the incisors. The stenosis was not                            traversed. Procedure was aborted given it was a                            high grade stricture in the proximal esophagus Complications:            No immediate complications. Estimated Blood Loss:     Estimated blood loss: none. Impression:               - Benign-appearing esophageal stenosis.                           - No specimens collected. Recommendation:           - Mechanical soft  diet.                           - Continue present medications.                           - Plan to schedule EGD at hospital endoscopy unit                            under general anaesthesia next available appointment Mauri Pole, MD 02/20/2022 10:19:05 AM This report has been signed electronically.

## 2022-02-20 NOTE — Patient Instructions (Signed)
Thank you for coming in to see [place] today. Recommend following a soft diet until endoscopy can be performed. We will contact you to schedule Endoscopy at the hospital to be performed under general anesthesia.  YOU HAD AN ENDOSCOPIC PROCEDURE TODAY AT [facility]:   Refer to the procedure report that was given to you for any specific questions about what was found during the examination.  If the procedure report does not answer your questions, please call your gastroenterologist to clarify.  If you requested that your care partner not be given the details of your procedure findings, then the procedure report has been included in a sealed envelope for you to review at your convenience later.  YOU SHOULD EXPECT: Some feelings of bloating in the abdomen. Passage of more gas than usual.  Walking can help get rid of the air that was put into your GI tract during the procedure and reduce the bloating. If you had a lower endoscopy (such as a colonoscopy or flexible sigmoidoscopy) you may notice spotting of blood in your stool or on the toilet paper. If you underwent a bowel prep for your procedure, you may not have a normal bowel movement for a few days.  Please Note:  You might notice some irritation and congestion in your nose or some drainage.  This is from the oxygen used during your procedure.  There is no need for concern and it should clear up in a day or so.  SYMPTOMS TO REPORT IMMEDIATELY:  Following upper endoscopy (EGD)  Vomiting of blood or coffee ground material  New chest pain or pain under the shoulder blades  Painful or persistently difficult swallowing  New shortness of breath  Fever of 100F or higher  Black, tarry-looking stools  For urgent or emergent issues, a gastroenterologist can be reached at any hour by calling [phone]. Do not use MyChart messaging for urgent concerns.    DIET:  We do recommend a small meal at first, but then you may proceed to your  regular diet.  Drink plenty of fluids but you should avoid alcoholic beverages for 24 hours.  ACTIVITY:  You should plan to take it easy for the rest of today and you should NOT DRIVE or use heavy machinery until tomorrow (because of the sedation medicines used during the test).    FOLLOW UP: Our staff will call the number listed on your records the next business day following your procedure.  We will call around 7:15- 8:00 am to check on you and address any questions or concerns that you may have regarding the information given to you following your procedure. If we do not reach you, we will leave a message.     If any biopsies were taken you will be contacted by phone or by letter within the next 1-3 weeks.  Please call us at [phone] if you have not heard about the biopsies in 3 weeks.    SIGNATURES/CONFIDENTIALITY: You and/or your care partner have signed paperwork which will be entered into your electronic medical record.  These signatures attest to the fact that that the information above on your After Visit Summary has been reviewed and is understood.  Full responsibility of the confidentiality of this discharge information lies with you and/or your care-partner.

## 2022-02-20 NOTE — Progress Notes (Signed)
VS completed by DT.  Pt's states no medical or surgical changes since previsit or office visit.  

## 2022-02-23 ENCOUNTER — Telehealth: Payer: Self-pay | Admitting: *Deleted

## 2022-02-23 NOTE — Telephone Encounter (Signed)
  Follow up Call-     02/20/2022    9:26 AM  Call back number  Post procedure Call Back phone  # [phone]  Permission to leave phone message Yes     Patient questions:  Do you have a fever, pain , or abdominal swelling? No. Pain Score  0 *  Have you tolerated food without any problems? Yes.    Have you been able to return to your normal activities? Yes.    Do you have any questions about your discharge instructions: Diet   No. Medications  No. Follow up visit  No.  Do you have questions or concerns about your Care? No.  Actions: * If pain score is 4 or above: No action needed, pain <4.

## 2022-02-24 ENCOUNTER — Telehealth: Payer: Self-pay | Admitting: Gastroenterology

## 2022-02-24 NOTE — Telephone Encounter (Signed)
The pt has been advised that Eustaquio Maize will call her as soon as she is able to set up procedure.

## 2022-02-24 NOTE — Telephone Encounter (Signed)
Patient called to follow up on EGD that needs to be scheduled at the hospital.

## 2022-02-26 ENCOUNTER — Telehealth: Payer: Self-pay | Admitting: Gastroenterology

## 2022-02-26 NOTE — Telephone Encounter (Signed)
Patient called states she would like schedule hospital EGD, and would like for a nurse to return her call. Please call to advise.

## 2022-02-27 ENCOUNTER — Other Ambulatory Visit: Payer: Self-pay

## 2022-02-27 DIAGNOSIS — K222 Esophageal obstruction: Secondary | ICD-10-CM

## 2022-02-27 NOTE — Telephone Encounter (Signed)
Thanks

## 2022-02-27 NOTE — Telephone Encounter (Signed)
Patient is scheduled for EGD under general anesthesia at [facility] on 04/21/22 at 8:45 am. Discussed with the patient.

## 2022-04-13 ENCOUNTER — Encounter (HOSPITAL_COMMUNITY): Payer: Self-pay | Admitting: Gastroenterology

## 2022-04-13 NOTE — Progress Notes (Signed)
Attempted to obtain medical history via telephone, unable to reach at this time. HIPAA compliant voicemail message left requesting return call to pre surgical testing department. 

## 2022-04-15 ENCOUNTER — Encounter (HOSPITAL_COMMUNITY): Payer: Self-pay | Admitting: Gastroenterology

## 2022-04-20 NOTE — Anesthesia Preprocedure Evaluation (Signed)
Anesthesia Evaluation  Patient identified by MRN, date of birth, ID band Patient awake    Reviewed: Allergy & Precautions, NPO status , Patient's Chart, lab work & pertinent test results  History of Anesthesia Complications (+) history of anesthetic complications (previous airway obstruction during endo procedure)  Airway Mallampati: II  TM Distance: >3 FB Neck ROM: Full    Dental  (+) Poor Dentition   Pulmonary neg pulmonary ROS   Pulmonary exam normal breath sounds clear to auscultation       Cardiovascular hypertension, (-) angina (-) Past MI, (-) Cardiac Stents and (-) CABG  Rhythm:Regular Rate:Normal     Neuro/Psych  PSYCHIATRIC DISORDERS Anxiety     negative neurological ROS     GI/Hepatic negative GI ROS, Neg liver ROS,,,  Endo/Other  negative endocrine ROS    Renal/GU negative Renal ROS     Musculoskeletal  (+) Arthritis ,    Abdominal  (+) - obese  Peds  Hematology negative hematology ROS (+)   Anesthesia Other Findings Procedure attempted on 02/20/2022 but had to be aborted due to airway obstruction.  rosacea  Reproductive/Obstetrics                             Anesthesia Physical Anesthesia Plan  ASA: 2  Anesthesia Plan: General   Post-op Pain Management:    Induction: Intravenous  PONV Risk Score and Plan: 3 and Ondansetron and Treatment may vary due to age or medical condition  Airway Management Planned: Oral ETT  Additional Equipment:   Intra-op Plan:   Post-operative Plan: Extubation in OR  Informed Consent: I have reviewed the patients History and Physical, chart, labs and discussed the procedure including the risks, benefits and alternatives for the proposed anesthesia with the patient or authorized representative who has indicated his/her understanding and acceptance.     Dental advisory given  Plan Discussed with: CRNA and  Anesthesiologist  Anesthesia Plan Comments: (Risks of general anesthesia discussed including, but not limited to, sore throat, hoarse voice, chipped/damaged teeth, injury to vocal cords, nausea and vomiting, allergic reactions, lung infection, heart attack, stroke, and death. All questions answered. )       Anesthesia Quick Evaluation

## 2022-04-21 ENCOUNTER — Encounter (HOSPITAL_COMMUNITY)
Admission: RE | Disposition: A | Discharge status: Home / Self Care | Payer: Self-pay | Source: Home / Self Care | Attending: Gastroenterology

## 2022-04-21 ENCOUNTER — Ambulatory Visit (HOSPITAL_COMMUNITY)
Admission: RE | Admit: 2022-04-21 | Discharge: 2022-04-21 | Disposition: A | Discharge status: Home / Self Care | Payer: Medicare Other | Attending: Gastroenterology | Admitting: Gastroenterology

## 2022-04-21 ENCOUNTER — Other Ambulatory Visit: Payer: Self-pay

## 2022-04-21 ENCOUNTER — Ambulatory Visit (HOSPITAL_COMMUNITY): Payer: Medicare Other | Admitting: Anesthesiology

## 2022-04-21 ENCOUNTER — Ambulatory Visit (HOSPITAL_BASED_OUTPATIENT_CLINIC_OR_DEPARTMENT_OTHER): Payer: Medicare Other | Admitting: Anesthesiology

## 2022-04-21 ENCOUNTER — Encounter (HOSPITAL_COMMUNITY): Payer: Self-pay | Admitting: Gastroenterology

## 2022-04-21 DIAGNOSIS — K2210 Ulcer of esophagus without bleeding: Secondary | ICD-10-CM | POA: Diagnosis not present

## 2022-04-21 DIAGNOSIS — K2970 Gastritis, unspecified, without bleeding: Secondary | ICD-10-CM

## 2022-04-21 DIAGNOSIS — K2101 Gastro-esophageal reflux disease with esophagitis, with bleeding: Secondary | ICD-10-CM

## 2022-04-21 DIAGNOSIS — F419 Anxiety disorder, unspecified: Secondary | ICD-10-CM | POA: Insufficient documentation

## 2022-04-21 DIAGNOSIS — R1310 Dysphagia, unspecified: Secondary | ICD-10-CM | POA: Diagnosis present

## 2022-04-21 DIAGNOSIS — M1990 Unspecified osteoarthritis, unspecified site: Secondary | ICD-10-CM | POA: Insufficient documentation

## 2022-04-21 DIAGNOSIS — K208 Other esophagitis without bleeding: Secondary | ICD-10-CM | POA: Diagnosis not present

## 2022-04-21 DIAGNOSIS — K319 Disease of stomach and duodenum, unspecified: Secondary | ICD-10-CM | POA: Insufficient documentation

## 2022-04-21 DIAGNOSIS — K219 Gastro-esophageal reflux disease without esophagitis: Secondary | ICD-10-CM | POA: Insufficient documentation

## 2022-04-21 DIAGNOSIS — K222 Esophageal obstruction: Secondary | ICD-10-CM | POA: Diagnosis not present

## 2022-04-21 DIAGNOSIS — Z800 Family history of malignant neoplasm of digestive organs: Secondary | ICD-10-CM | POA: Insufficient documentation

## 2022-04-21 DIAGNOSIS — R1319 Other dysphagia: Secondary | ICD-10-CM

## 2022-04-21 HISTORY — PX: ESOPHAGOGASTRODUODENOSCOPY: SHX5428

## 2022-04-21 HISTORY — PX: BIOPSY: SHX5522

## 2022-04-21 HISTORY — DX: Other complications of anesthesia, initial encounter: T88.59XA

## 2022-04-21 LAB — GLUCOSE, CAPILLARY
Glucose-Capillary: 91 mg/dL (ref 70–99)
Glucose-Capillary: 90 mg/dL (ref 70–99)

## 2022-04-21 SURGERY — EGD (ESOPHAGOGASTRODUODENOSCOPY)
Anesthesia: General

## 2022-04-21 MED ORDER — LACTATED RINGERS IV SOLN: INTRAVENOUS | Status: DC

## 2022-04-21 MED ORDER — ONDANSETRON HCL 4 MG/2ML IJ SOLN
INTRAMUSCULAR | Status: DC | PRN
  Administered 2022-04-21: 4 mg via INTRAVENOUS

## 2022-04-21 MED ORDER — PROPOFOL 10 MG/ML IV BOLUS
INTRAVENOUS | Status: DC | PRN
  Administered 2022-04-21: 150 mg via INTRAVENOUS

## 2022-04-21 MED ORDER — OMEPRAZOLE MAGNESIUM 20 MG PO TBEC: 40.0000 mg | DELAYED_RELEASE_TABLET | Freq: Two times a day (BID) | ORAL | 3 refills | Status: DC

## 2022-04-21 MED ORDER — FENTANYL CITRATE (PF) 100 MCG/2ML IJ SOLN
INTRAMUSCULAR | Status: AC
  Filled 2022-04-21: qty 2

## 2022-04-21 MED ORDER — FENTANYL CITRATE (PF) 100 MCG/2ML IJ SOLN
INTRAMUSCULAR | Status: DC | PRN
  Administered 2022-04-21 (×2): 50 ug via INTRAVENOUS

## 2022-04-21 MED ORDER — LACTATED RINGERS IV SOLN
INTRAVENOUS | Status: AC | PRN
  Administered 2022-04-21: 1000 mL via INTRAVENOUS

## 2022-04-21 MED ORDER — SUCRALFATE 1 GM/10ML PO SUSP: 1.0000 g | Freq: Three times a day (TID) | ORAL | 1 refills | Status: DC

## 2022-04-21 MED ORDER — LIDOCAINE 2% (20 MG/ML) 5 ML SYRINGE
INTRAMUSCULAR | Status: DC | PRN
  Administered 2022-04-21: 60 mg via INTRAVENOUS

## 2022-04-21 MED ORDER — PROPOFOL 10 MG/ML IV BOLUS
INTRAVENOUS | Status: AC
  Filled 2022-04-21: qty 20

## 2022-04-21 MED ORDER — SUCCINYLCHOLINE CHLORIDE 200 MG/10ML IV SOSY
PREFILLED_SYRINGE | INTRAVENOUS | Status: DC | PRN
  Administered 2022-04-21: 100 mg via INTRAVENOUS

## 2022-04-21 MED ORDER — SODIUM CHLORIDE 0.9 % IV SOLN: INTRAVENOUS | Status: DC

## 2022-04-21 NOTE — Transfer of Care (Signed)
Immediate Anesthesia Transfer of Care Note  Patient: Ana Graves  Procedure(s) Performed: ESOPHAGOGASTRODUODENOSCOPY (EGD) Balloon dilation wire-guided BIOPSY  Patient Location: PACU  Anesthesia Type:General  Level of Consciousness: awake and patient cooperative  Airway & Oxygen Therapy: Patient Spontanous Breathing and Patient connected to nasal cannula oxygen  Post-op Assessment: Report given to RN and Post -op Vital signs reviewed and stable  Post vital signs: Reviewed and stable  Last Vitals:  Vitals Value Taken Time  BP 143/79 04/21/22 1002  Temp    Pulse 86 04/21/22 1003  Resp 17 04/21/22 1003  SpO2 93 % 04/21/22 1003  Vitals shown include unvalidated device data.  Last Pain:  Vitals:   04/21/22 0727  TempSrc: Tympanic  PainSc: 0-No pain         Complications: No notable events documented.

## 2022-04-21 NOTE — H&P (Signed)
[facility] History and Physical   Primary Care Physician:  Leeroy Cha, MD   Reason for Procedure:  High-grade proximal esophageal stricture  Plan:    EGD with esophageal dilation     HPI: Ana Graves is a 66 y.o. female here for EGD with esophageal dilation, has a high-grade proximal esophageal stricture, unable to intubate the esophagus when it was attempted previously.  Plan to proceed with pediatric endoscope and Savary dilation  The risks and benefits as well as alternatives of endoscopic procedure(s) have been discussed and reviewed. All questions answered. The patient agrees to proceed.    Past Medical History:  Diagnosis Date   Anxiety    Arthritis    Rhuematoid arthritis   Blood transfusion without reported diagnosis 2007   Cancer [facility]) 2012   skin cancer; face   Complication of anesthesia    airway obstruction during endo procedure   Elevated LFTs    Hemorrhoids    History of colon polyps    Hypoglycemia    Rosacea     Past Surgical History:  Procedure Laterality Date   KNEE ARTHROSCOPY Left 1994   MOHS SURGERY  2012   [place]   surgery for skin cancer   [place]  2004    Prior to Admission medications   Medication Sig Start Date End Date Taking? Authorizing Provider  HUMIRA PEN 40 MG/0.4ML PNKT Inject 40 mg into the skin every 14 (fourteen) days. 02/02/22  Yes [provider]  ISOtretinoin (CLARAVIS) 40 MG capsule Take 40 mg by mouth daily.   Yes [provider]  Multiple Vitamin (MULTIVITAMIN) tablet Take 1 tablet by mouth daily.   Yes [provider]  mupirocin cream (BACTROBAN) 2 % Apply 1 Application topically daily.   Yes [provider]  sertraline (ZOLOFT) 50 MG tablet Take 50 mg by mouth daily.   Yes [provider]  ondansetron (ZOFRAN-ODT) 4 MG disintegrating tablet Take 1 tablet (4 mg total) by mouth every  8 (eight) hours as needed for nausea or vomiting. Patient not taking: Reported on 04/15/2022 01/05/22   Noralyn Pick, NP    Current Facility-Administered Medications  Medication Dose Route Frequency Provider Last Rate Last Admin   0.9 %  sodium chloride infusion   Intravenous Continuous Jeaninne Lodico, Venia Minks, MD       lactated ringers infusion   Intravenous Continuous Jashiya Bassett, Venia Minks, MD       lactated ringers infusion    Continuous PRN Mauri Pole, MD 10 mL/hr at 04/21/22 0736 1,000 mL at 04/21/22 0736    Allergies as of 02/27/2022   (No Known Allergies)    Family History  Problem Relation Age of Onset   Stomach cancer Mother    Rectal cancer Sister    Colon cancer Neg Hx    Esophageal cancer Neg Hx     Social History   Socioeconomic History   Marital status: Married    Spouse name: Not on file   Number of children: Not on file   Years of education: Not on file   Highest education level: Not on file  Occupational History   Not on file  Tobacco Use   Smoking status: Never   Smokeless tobacco: Never  Vaping Use   Vaping Use: Never used  Substance and Sexual Activity   Alcohol use: Yes    Alcohol/week: 21.0 standard drinks of alcohol    Types:  21 Glasses of wine per week   Drug use: No   Sexual activity: Not on file  Other Topics Concern   Not on file  Social History Narrative   Not on file   Social Determinants of Health   Financial Resource Strain: Not on file  Food Insecurity: Not on file  Transportation Needs: Not on file  Physical Activity: Not on file  Stress: Not on file  Social Connections: Not on file  Intimate Partner Violence: Not on file    Review of Systems:  All other review of systems negative except as mentioned in the HPI.  Physical Exam: Vital signs in last 24 hours: Temp:  [98.8 F (37.1 C)] 98.8 F (37.1 C) (11/28 0727) Pulse Rate:  [79] 79 (11/28 0727) Resp:  [20] 20 (11/28 0727) BP: (134)/(89) 134/89  (11/28 0727) SpO2:  [100 %] 100 % (11/28 0727) Weight:  [56.1 kg] 56.1 kg (11/28 0727)   General:   Alert, NAD Lungs:  Clear .   Heart:  Regular rate and rhythm Abdomen:  Soft, nontender and nondistended. Neuro/Psych:  Alert and cooperative. Normal mood and affect. A and O x 3   K. Denzil Magnuson , MD [phone]

## 2022-04-21 NOTE — Anesthesia Postprocedure Evaluation (Signed)
Anesthesia Post Note  Patient: Ana Graves  Procedure(s) Performed: ESOPHAGOGASTRODUODENOSCOPY (EGD) Balloon dilation wire-guided BIOPSY     Patient location during evaluation: PACU Anesthesia Type: General Level of consciousness: awake Pain management: pain level controlled Vital Signs Assessment: post-procedure vital signs reviewed and stable Respiratory status: spontaneous breathing, nonlabored ventilation and respiratory function stable Cardiovascular status: blood pressure returned to baseline and stable Postop Assessment: no apparent nausea or vomiting Anesthetic complications: no   No notable events documented.  Last Vitals:  Vitals:   04/21/22 1013 04/21/22 1023  BP: 132/81 137/81  Pulse: 80 81  Resp: (!) 23 19  Temp:    SpO2: 93% 94%    Last Pain:  Vitals:   04/21/22 1023  TempSrc:   PainSc: 0-No pain                 Nilda Simmer

## 2022-04-21 NOTE — Anesthesia Procedure Notes (Signed)
Procedure Name: Intubation Date/Time: 04/21/2022 9:26 AM  Performed by: Claudia Desanctis, CRNAPre-anesthesia Checklist: Patient identified, Emergency Drugs available, Suction available and Patient being monitored Patient Re-evaluated:Patient Re-evaluated prior to induction Oxygen Delivery Method: Circle system utilized Preoxygenation: Pre-oxygenation with 100% oxygen Induction Type: IV induction Laryngoscope Size: 2 and Miller Grade View: Grade I Tube type: Oral Tube size: 7.0 mm Number of attempts: 1 Airway Equipment and Method: Stylet Placement Confirmation: ETT inserted through vocal cords under direct vision, positive ETCO2 and breath sounds checked- equal and bilateral Secured at: 21 cm Tube secured with: Tape Dental Injury: Teeth and Oropharynx as per pre-operative assessment

## 2022-04-21 NOTE — Discharge Instructions (Signed)
YOU HAD AN ENDOSCOPIC PROCEDURE TODAY: Refer to the procedure report and other information in the discharge instructions given to you for any specific questions about what was found during the examination. If this information does not answer your questions, please call [place] office at [phone] to clarify.   YOU SHOULD EXPECT: Some feelings of bloating in the abdomen. Passage of more gas than usual. Walking can help get rid of the air that was put into your GI tract during the procedure and reduce the bloating. If you had a lower endoscopy (such as a colonoscopy or flexible sigmoidoscopy) you may notice spotting of blood in your stool or on the toilet paper. Some abdominal soreness may be present for a day or two, also.  DIET: Your first meal following the procedure should be a light meal and then it is ok to progress to your normal diet. A half-sandwich or bowl of soup is an example of a good first meal. Heavy or fried foods are harder to digest and may make you feel nauseous or bloated. Drink plenty of fluids but you should avoid alcoholic beverages for 24 hours. If you had a esophageal dilation, please see attached instructions for diet.    ACTIVITY: Your care partner should take you home directly after the procedure. You should plan to take it easy, moving slowly for the rest of the day. You can resume normal activity the day after the procedure however YOU SHOULD NOT DRIVE, use power tools, machinery or perform tasks that involve climbing or major physical exertion for 24 hours (because of the sedation medicines used during the test).   SYMPTOMS TO REPORT IMMEDIATELY: A gastroenterologist can be reached at any hour. Please call [phone]  for any of the following symptoms:   Following upper endoscopy (EGD, EUS, ERCP, esophageal dilation) Vomiting of blood or coffee ground material  New, significant abdominal pain  New, significant chest pain or pain under the shoulder blades  Painful or  persistently difficult swallowing  New shortness of breath  Black, tarry-looking or red, bloody stools  FOLLOW UP:  If any biopsies were taken you will be contacted by phone or by letter within the next 1-3 weeks. Call [phone]  if you have not heard about the biopsies in 3 weeks.  Please also call with any specific questions about appointments or follow up tests.  

## 2022-04-21 NOTE — Op Note (Signed)
[facility] Patient Name: Ana Graves Procedure Date: 04/21/2022 MRN: [id number] Attending MD: Mauri Pole , MD, [id number] Date of Birth: February 08, 1956 CSN: [id number] Age: 66 Admit Type: Outpatient Procedure:                Upper GI endoscopy Indications:              Dysphagia, For therapy of esophageal stricture Providers:                Mauri Pole, MD, Jaci Carrel, RN,                            William Dalton, Technician Referring MD:              Medicines:                Intubated/General Anesthesia Complications:            No immediate complications. Estimated Blood Loss:     Estimated blood loss was minimal. Procedure:                Pre-Anesthesia Assessment:                           - Prior to the procedure, a History and Physical                            was performed, and patient medications and                            allergies were reviewed. The patient's tolerance of                            previous anesthesia was also reviewed. The risks                            and benefits of the procedure and the sedation                            options and risks were discussed with the patient.                            All questions were answered, and informed consent                            was obtained. Prior Anticoagulants: The patient has                            taken no anticoagulant or antiplatelet agents. ASA                            Grade Assessment: III - A patient with severe                            systemic disease. After reviewing the risks and  benefits, the patient was deemed in satisfactory                            condition to undergo the procedure.                           After obtaining informed consent, the endoscope was                            passed under direct vision. Throughout the                            procedure, the patient's blood pressure,  pulse, and                            oxygen saturations were monitored continuously. The                            [id number]-H190 ([id number]) Olympus endoscope was introduced                            through the mouth, and advanced to the second part                            of duodenum. The upper GI endoscopy was somewhat                            difficult due to stricture. The patient tolerated                            the procedure well. Scope In: Scope Out: Findings:      A few benign-appearing, intrinsic severe (stenosis; an endoscope cannot       pass) stenoses were found 20 to 38 cm from the incisors (Tight severe       stricture in proximal esophagus and then again near EG junction with few       mild to moderate strictures in the mid esophagus). The narrowest       stenosis measured 9 mm (inner diameter). The stenoses were traversed       after dilation. The dilation site was examined following endoscope       reinsertion and showed mild mucosal disruption and moderate improvement       in luminal narrowing. Biopsies were obtained from the proximal and       distal esophagus with cold forceps for histology of suspected       eosinophilic esophagitis.      LA Grade C (one or more mucosal breaks continuous between tops of 2 or       more mucosal folds, less than 75% circumference) esophagitis with no       bleeding was found 34 to 38 cm from the incisors. Biopsies were taken       with a cold forceps for histology.      Patchy moderate inflammation characterized by congestion (edema) and       erythema was found in the gastric antrum and in the prepyloric region of  the stomach. Biopsies were taken with a cold forceps for Helicobacter       pylori testing.      The cardia and gastric fundus were normal on retroflexion.      The examined duodenum was normal. Impression:               - Benign-appearing esophageal stenoses.                           - LA Grade C erosive  esophagitis with no bleeding.                            Biopsied.                           - Gastritis. Biopsied.                           - Normal examined duodenum.                           - Biopsies were taken with a cold forceps for                            evaluation of eosinophilic esophagitis. Moderate Sedation:      N/A Recommendation:           - Patient has a contact number available for                            emergencies. The signs and symptoms of potential                            delayed complications were discussed with the                            patient. Return to normal activities tomorrow.                            Written discharge instructions were provided to the                            patient.                           - Full liquid diet today, then advance as tolerated                            to mechanical soft diet.                           - Continue present medications.                           - No aspirin, ibuprofen, naproxen, or other                            non-steroidal anti-inflammatory drugs for  2 weeks.                           - Follow an antireflux regimen.                           - Use Prilosec (omeprazole) 40 mg PO BID, 30                            minutes before breakfast and dinner                           - Use sucralfate suspension 1 gram PO QID for 2                            weeks.                           - Return to GI office at the next available                            appointment.                           - Repeat upper endoscopy in 1-2 months for                            retreatment, can be scheduled at [facility]. Procedure Code(s):        --- Professional ---                           [phone], Esophagogastroduodenoscopy, flexible,                            transoral; with biopsy, single or multiple Diagnosis Code(s):        --- Professional ---                           K22.2, Esophageal  obstruction                           K20.80, Other esophagitis without bleeding                           K29.70, Gastritis, unspecified, without bleeding                           R13.10, Dysphagia, unspecified CPT copyright 2022 American [employer] Association. All rights reserved. The codes documented in this report are preliminary and upon coder review may  be revised to meet current compliance requirements. Mauri Pole, MD 04/21/2022 10:03:02 AM This report has been signed electronically. Number of Addenda: 0

## 2022-04-23 ENCOUNTER — Telehealth: Payer: Self-pay | Admitting: Gastroenterology

## 2022-04-23 LAB — SURGICAL PATHOLOGY

## 2022-04-23 NOTE — Telephone Encounter (Signed)
Dr Silverio Decamp The patient is scheduled for office follow up in February. Do you want her to have follow up sooner with her APP? She is interested in an estimation of how long she will be on a PPI. Also, she would like to see if there is a PPI covered by her insurance if she is going to be taking it for more than a couple of weeks. She reports she is doing okay since her EGD, but she was surprised at her level of discomfort the first 24 hours.

## 2022-04-23 NOTE — Telephone Encounter (Signed)
Inbound call from patient stating that she had a procedure with Dr. Silverio Decamp at [facility] on 11/28 and had a few questions to ask the nurse. Please advise.

## 2022-04-24 ENCOUNTER — Encounter (HOSPITAL_COMMUNITY): Payer: Self-pay | Admitting: Gastroenterology

## 2022-04-24 NOTE — Telephone Encounter (Signed)
Inbound call from patient stating she still has a few f/u questions from yesterday conversation. Please advise.  Thank you

## 2022-04-27 ENCOUNTER — Other Ambulatory Visit: Payer: Self-pay

## 2022-04-27 ENCOUNTER — Encounter: Payer: Self-pay | Admitting: Gastroenterology

## 2022-04-27 MED ORDER — OMEPRAZOLE 40 MG PO CPDR: 40.0000 mg | DELAYED_RELEASE_CAPSULE | Freq: Two times a day (BID) | ORAL | 3 refills | Status: DC

## 2022-04-27 NOTE — Telephone Encounter (Signed)
Its difficult to know for sure until she checks with pharmacy or insurance. If her insurance doesn't have PPI coverage, will recommend using OTC PPI twice daily for atleast 3 months

## 2022-04-29 ENCOUNTER — Telehealth: Payer: Self-pay | Admitting: Gastroenterology

## 2022-04-29 ENCOUNTER — Other Ambulatory Visit: Payer: Self-pay

## 2022-04-29 MED ORDER — SUCRALFATE 1 GM/10ML PO SUSP: ORAL | 1 refills | Status: DC

## 2022-04-29 NOTE — Telephone Encounter (Signed)
Patient is calling says thank you for last prescription, also states she is out of her Sucralfate Rx and pharmacy is saying they can't refill until 12/16. Please advise

## 2022-04-29 NOTE — Telephone Encounter (Signed)
She can continue to use as needed going forward but doesn't have to use to continuously with every meal. Please send her refill for PRN use 1gm before meals and bedtime. Thanks

## 2022-04-29 NOTE — Telephone Encounter (Signed)
The Carafate had been for a specific number of days. She is noting improvement in her swallowing. Do you want to extend the prescription?

## 2022-04-30 NOTE — Telephone Encounter (Signed)
Patient is instructed and she will let us know if there are any concerns.

## 2022-05-05 ENCOUNTER — Other Ambulatory Visit: Payer: Self-pay | Admitting: Nurse Practitioner

## 2022-05-05 NOTE — Telephone Encounter (Signed)
Please advise 

## 2022-05-06 ENCOUNTER — Other Ambulatory Visit (HOSPITAL_COMMUNITY): Payer: Self-pay

## 2022-05-06 NOTE — Telephone Encounter (Signed)
Prior authorization not needed Beth... Thanks

## 2022-05-06 NOTE — Telephone Encounter (Signed)
PA not needed. Last filled on 12.4.23 via retail.

## 2022-05-24 ENCOUNTER — Other Ambulatory Visit: Payer: Self-pay | Admitting: Gastroenterology

## 2022-05-28 ENCOUNTER — Telehealth: Payer: Self-pay | Admitting: Gastroenterology

## 2022-05-28 NOTE — Telephone Encounter (Signed)
Called the patient back to discuss. No answer. Left her a voicemail advising she does not need to fast and Dr Silverio Decamp will want to see her before having any labs drawn.

## 2022-05-28 NOTE — Telephone Encounter (Signed)
Inbound call from patient requesting a call back to discuss if she needs labs done before her appointment on 1/12 with Dr.Nandigam and if she will need to fast. Please advise.

## 2022-05-29 NOTE — Telephone Encounter (Signed)
Advised to not take Sucralfate. She has been off of it for several days at this point. Until then she had taken it for 21 days.

## 2022-05-29 NOTE — Telephone Encounter (Signed)
Patient returned call, stated she also was inquiring if she should continue taking the Sucralfate medication or if she should stop. Please advise.

## 2022-06-05 ENCOUNTER — Encounter: Payer: Self-pay | Admitting: Gastroenterology

## 2022-06-05 ENCOUNTER — Ambulatory Visit (INDEPENDENT_AMBULATORY_CARE_PROVIDER_SITE_OTHER): Payer: Medicare Other | Admitting: Gastroenterology

## 2022-06-05 VITALS — BP 120/60 | HR 93 | Ht 64.0 in | Wt 129.0 lb

## 2022-06-05 DIAGNOSIS — K219 Gastro-esophageal reflux disease without esophagitis: Secondary | ICD-10-CM | POA: Diagnosis not present

## 2022-06-05 DIAGNOSIS — K222 Esophageal obstruction: Secondary | ICD-10-CM | POA: Diagnosis not present

## 2022-06-05 MED ORDER — OMEPRAZOLE 40 MG PO CPDR: 40.0000 mg | DELAYED_RELEASE_CAPSULE | Freq: Two times a day (BID) | ORAL | 11 refills | Status: DC

## 2022-06-05 NOTE — Patient Instructions (Signed)
You have been scheduled for an endoscopy. Please follow written instructions given to you at your visit today. If you use inhalers (even only as needed), please bring them with you on the day of your procedure.  We have sent the following medications to your pharmacy for you to pick up at your convenience: omeprazole.   The [place] [facility] providers would like to encourage you to use [facility] to communicate with providers for non-urgent requests or questions.  Due to long hold times on the telephone, sending your provider a message by [facility] may be a faster and more efficient way to get a response.  Please allow 48 business hours for a response.  Please remember that this is for non-urgent requests.   Due to recent changes in healthcare laws, you may see the results of your imaging and laboratory studies on MyChart before your provider has had a chance to review them.  We understand that in some cases there may be results that are confusing or concerning to you. Not all laboratory results come back in the same time frame and the provider may be waiting for multiple results in order to interpret others.  Please give [place] 48 hours in order for your provider to thoroughly review all the results before contacting the office for clarification of your results.

## 2022-06-05 NOTE — Progress Notes (Signed)
Ana Graves    [id number]    [birth year]  Primary Care Physician:Varadarajan, Ronie Spies, MD  Referring Physician: Leeroy Cha, MD [address] [place],  [place] [address]   Chief complaint: GERD, erosive esophagitis  HPI:  67 year old very pleasant female with history of esophageal stricture, and erosive esophagitis here for follow-up visit . Overall she is doing well.  No longer has nausea or vomiting.  Denies any difficulty swallowing.  She completed  taking Carafate and omeprazole, currently not on any acid suppression  Denies any nausea, vomiting, abdominal pain, melena or bright red blood per rectum   EGD 04/21/2022: Esophageal stricture dilated. Esophageal biopsies consistent with acid reflux esophagitis negative for EOE  EGD 02/20/2022: Proximal esophageal stricture  Colonoscopy 05/03/2019: 3 dimunitive tubular adenomas were removed  colonoscopy 03/20/2016: 4 sessile diminutive polyps [2 tubular adenomas 1 sessile serrated adenoma and hyperplastic polyp] and internal hemorrhoids.     Outpatient Encounter Medications as of 06/05/2022  Medication Sig   HUMIRA PEN 40 MG/0.4ML PNKT Inject 40 mg into the skin every 14 (fourteen) days.   ISOtretinoin (CLARAVIS) 40 MG capsule Take 40 mg by mouth daily.   Multiple Vitamin (MULTIVITAMIN) tablet Take 1 tablet by mouth daily.   sertraline (ZOLOFT) 50 MG tablet Take 50 mg by mouth daily.   sucralfate (CARAFATE) 1 GM/10ML suspension TAKE 10 MLS (1 G TOTAL) BY MOUTH 4 (FOUR) TIMES DAILY AS NEEDED.   [DISCONTINUED] omeprazole (PRILOSEC) 40 MG capsule Take 1 capsule (40 mg total) by mouth 2 (two) times daily before a meal. Take on an empty stomach   omeprazole (PRILOSEC) 40 MG capsule Take 1 capsule (40 mg total) by mouth 2 (two) times daily before a meal. Take on an empty stomach   [DISCONTINUED] famotidine (PEPCID) 20 MG tablet TAKE 1 TABLET BY MOUTH TWICE A DAY   [DISCONTINUED] mupirocin  cream (BACTROBAN) 2 % Apply 1 Application topically daily.   [DISCONTINUED] omeprazole (PRILOSEC OTC) 20 MG tablet Take 2 tablets (40 mg total) by mouth 2 (two) times daily at 8 am and 10 pm.   [DISCONTINUED] ondansetron (ZOFRAN-ODT) 4 MG disintegrating tablet Take 1 tablet (4 mg total) by mouth every 8 (eight) hours as needed for nausea or vomiting. (Patient not taking: Reported on 04/15/2022)   Facility-Administered Encounter Medications as of 06/05/2022  Medication   0.9 %  sodium chloride infusion    Allergies as of 06/05/2022   (No Known Allergies)    Past Medical History:  Diagnosis Date   Anxiety    Arthritis    Rhuematoid arthritis   Blood transfusion without reported diagnosis 2007   Cancer [facility]) 2012   skin cancer; face   Complication of anesthesia    airway obstruction during endo procedure   Elevated LFTs    Hemorrhoids    History of colon polyps    Hypoglycemia    Rosacea     Past Surgical History:  Procedure Laterality Date   BIOPSY  04/21/2022   Procedure: BIOPSY;  Surgeon: Mauri Pole, MD;  Location: [facility];  Service: Gastroenterology;;   ESOPHAGOGASTRODUODENOSCOPY N/A 04/21/2022   Procedure: ESOPHAGOGASTRODUODENOSCOPY (EGD);  Surgeon: Mauri Pole, MD;  Location: [facility];  Service: Gastroenterology;  Laterality: N/A;   KNEE ARTHROSCOPY Left 1994   MOHS SURGERY  2012   OTHER SURGICAL HISTORY  1996   surgery for skin cancer   [place]  2004    Family History  Problem Relation Age of Onset   Stomach cancer Mother    Rectal cancer Sister    Colon cancer Neg Hx    Esophageal cancer Neg Hx     Social History   Socioeconomic History   Marital status: Married    Spouse name: Not on file   Number of children: Not on file   Years of education: Not on file   Highest education level: Not on file  Occupational History   Not on file  Tobacco Use   Smoking status: Never   Smokeless  tobacco: Never  Vaping Use   Vaping Use: Never used  Substance and Sexual Activity   Alcohol use: Yes    Alcohol/week: 21.0 standard drinks of alcohol    Types: 21 Glasses of wine per week   Drug use: No   Sexual activity: Not on file  Other Topics Concern   Not on file  Social History Narrative   Not on file   Social Determinants of Health   Financial Resource Strain: Not on file  Food Insecurity: Not on file  Transportation Needs: Not on file  Physical Activity: Not on file  Stress: Not on file  Social Connections: Not on file  Intimate Partner Violence: Not on file      Review of systems: All other review of systems negative except as mentioned in the HPI.   Physical Exam: Vitals:   06/05/22 1350  BP: 120/60  Pulse: 93   Body mass index is 22.14 kg/m. Gen:      No acute distress HEENT:  sclera anicteric Neuro: alert and oriented x 3 Psych: normal mood and affect  Data Reviewed:  Reviewed labs, radiology imaging, old records and pertinent past GI work up   Assessment and Plan/Recommendations:  67 year old very pleasant female with severe erosive esophagitis and multiple esophageal strictures, biopsies negative for EOE Scheduled for EGD to document healing of mucosa and obtain additional biopsies if needed and esophageal dilation as needed Omeprazole 20 mg twice daily, before breakfast and dinner Continue antireflux measures  History of multiple adenomatous colon polyps: Due for surveillance colonoscopy December 2023, will schedule it once erosive esophagitis resolves so she will be able to tolerate the bowel prep Return after EGD  This visit required 40 minutes of patient care (this includes precharting, chart review, review of results, face-to-face time used for counseling as well as treatment plan and follow-up. The patient was provided an opportunity to ask questions and all were answered. The patient agreed with the plan and demonstrated an  understanding of the instructions.  Damaris Hippo , MD    CC: Leeroy Cha

## 2022-06-10 ENCOUNTER — Encounter: Payer: Self-pay | Admitting: Gastroenterology

## 2022-06-16 ENCOUNTER — Other Ambulatory Visit: Payer: Self-pay | Admitting: Family Medicine

## 2022-06-16 DIAGNOSIS — R748 Abnormal levels of other serum enzymes: Secondary | ICD-10-CM

## 2022-06-19 ENCOUNTER — Ambulatory Visit (HOSPITAL_COMMUNITY)
Admission: RE | Admit: 2022-06-19 | Discharge: 2022-06-19 | Disposition: A | Discharge status: Home / Self Care | Payer: Medicare Other | Source: Ambulatory Visit | Attending: Family Medicine | Admitting: Family Medicine

## 2022-06-19 DIAGNOSIS — R748 Abnormal levels of other serum enzymes: Secondary | ICD-10-CM | POA: Insufficient documentation

## 2022-06-26 ENCOUNTER — Encounter: Payer: Self-pay | Admitting: Gastroenterology

## 2022-06-26 ENCOUNTER — Ambulatory Visit (AMBULATORY_SURGERY_CENTER): Payer: Medicare Other | Admitting: Gastroenterology

## 2022-06-26 ENCOUNTER — Other Ambulatory Visit: Payer: Self-pay | Admitting: Gastroenterology

## 2022-06-26 VITALS — BP 160/88 | HR 72 | Temp 99.6°F | Resp 17 | Ht 64.0 in | Wt 129.0 lb

## 2022-06-26 DIAGNOSIS — R112 Nausea with vomiting, unspecified: Secondary | ICD-10-CM

## 2022-06-26 DIAGNOSIS — K219 Gastro-esophageal reflux disease without esophagitis: Secondary | ICD-10-CM

## 2022-06-26 DIAGNOSIS — K222 Esophageal obstruction: Secondary | ICD-10-CM | POA: Diagnosis not present

## 2022-06-26 MED ORDER — SUCRALFATE 1 GM/10ML PO SUSP: 1.0000 g | Freq: Two times a day (BID) | ORAL | 0 refills | Status: DC

## 2022-06-26 MED ORDER — SODIUM CHLORIDE 0.9 % IV SOLN: 500.0000 mL | Freq: Once | INTRAVENOUS | Status: DC

## 2022-06-26 NOTE — Progress Notes (Signed)
Pt's states no medical or surgical changes since previsit or office visit. VS assessed by D.T 

## 2022-06-26 NOTE — Progress Notes (Signed)
Please refer to office visit note 06/05/22. No additional changes in H&P Patient is appropriate for planned procedure(s) and anesthesia in an ambulatory setting  K. Denzil Magnuson , MD [phone]

## 2022-06-26 NOTE — Patient Instructions (Signed)
Referral to [facility].   Patient may call our office if has further questions.   Full Liquid diet today, then advance to mechanical soft diet.  Continue present medications. Follow an antireflux regimen.  Use Prilosec 40 mg oral twice a day.  Sucralfate suspension 1 gram oral twice a day for 4 weeks.  Alcohol cessation.  Follow up in GI office next available appointment for abnormal LFT- alcohol induced liver injury. Will plan for repeat EGD with dilation in 2-3 months.    YOU HAD AN ENDOSCOPIC PROCEDURE TODAY AT [facility]:   Refer to the procedure report that was given to you for any specific questions about what was found during the examination.  If the procedure report does not answer your questions, please call your gastroenterologist to clarify.  If you requested that your care partner not be given the details of your procedure findings, then the procedure report has been included in a sealed envelope for you to review at your convenience later.  YOU SHOULD EXPECT: Some feelings of bloating in the abdomen. Passage of more gas than usual.  Walking can help get rid of the air that was put into your GI tract during the procedure and reduce the bloating. If you had a lower endoscopy (such as a colonoscopy or flexible sigmoidoscopy) you may notice spotting of blood in your stool or on the toilet paper. If you underwent a bowel prep for your procedure, you may not have a normal bowel movement for a few days.  Please Note:  You might notice some irritation and congestion in your nose or some drainage.  This is from the oxygen used during your procedure.  There is no need for concern and it should clear up in a day or so.  SYMPTOMS TO REPORT IMMEDIATELY:   Following upper endoscopy (EGD)  Vomiting of blood or coffee ground material  New chest pain or pain under the shoulder blades  Painful or persistently difficult swallowing  New shortness of breath  Fever of 100F or  higher  Black, tarry-looking stools  For urgent or emergent issues, a gastroenterologist can be reached at any hour by calling [phone]. Do not use MyChart messaging for urgent concerns.    DIET:  We do recommend a small meal at first, but then you may proceed to your regular diet.  Drink plenty of fluids but you should avoid alcoholic beverages for 24 hours.  ACTIVITY:  You should plan to take it easy for the rest of today and you should NOT DRIVE or use heavy machinery until tomorrow (because of the sedation medicines used during the test).    FOLLOW UP: Our staff will call the number listed on your records the next business day following your procedure.  We will call around 7:15- 8:00 am to check on you and address any questions or concerns that you may have regarding the information given to you following your procedure. If we do not reach you, we will leave a message.     If any biopsies were taken you will be contacted by phone or by letter within the next 1-3 weeks.  Please call us at [phone] if you have not heard about the biopsies in 3 weeks.    SIGNATURES/CONFIDENTIALITY: You and/or your care partner have signed paperwork which will be entered into your electronic medical record.  These signatures attest to the fact that that the information above on your After Visit Summary has been reviewed  and is understood.  Full responsibility of the confidentiality of this discharge information lies with you and/or your care-partner. 

## 2022-06-26 NOTE — Op Note (Signed)
[place] Patient Name: Kinza Gouveia Procedure Date: 06/26/2022 10:25 AM MRN: [id number] Endoscopist: Mauri Pole , MD, [id number] Age: 67 Referring MD:  Date of Birth: [birth year] Gender: Female Account #: [id number] Procedure:                Upper GI endoscopy Indications:              Dysphagia, Esophageal reflux symptoms that persist                            despite appropriate therapy Medicines:                Monitored Anesthesia Care Procedure:                Pre-Anesthesia Assessment:                           - Prior to the procedure, a History and Physical                            was performed, and patient medications and                            allergies were reviewed. The patient's tolerance of                            previous anesthesia was also reviewed. The risks                            and benefits of the procedure and the sedation                            options and risks were discussed with the patient.                            All questions were answered, and informed consent                            was obtained. Prior Anticoagulants: The patient has                            taken no anticoagulant or antiplatelet agents. ASA                            Grade Assessment: III - A patient with severe                            systemic disease. After reviewing the risks and                            benefits, the patient was deemed in satisfactory                            condition to undergo the procedure.  After obtaining informed consent, the endoscope was                            passed under direct vision. Throughout the                            procedure, the patient's blood pressure, pulse, and                            oxygen saturations were monitored continuously. The                            Olympus Scope [id number] was introduced through the                            mouth,  and advanced to the second part of duodenum.                            The upper GI endoscopy was accomplished without                            difficulty. The patient tolerated the procedure                            well. Scope In: Scope Out: Findings:                 One benign-appearing, intrinsic moderate                            (circumferential scarring or stenosis; an endoscope                            may pass) stenosis was found 25 to 26 cm from the                            incisors. This stenosis measured 9 mm (inner                            diameter) x less than one cm (in length). The                            stenosis was traversed with minimal resistance by                            scope. Noted superficial mucosal tear with scope                            insertion, additional dilation was not attempted                           LA Grade B (one or more mucosal breaks greater than  5 mm, not extending between the tops of two mucosal                            folds) esophagitis with no bleeding was found 30 to                            38 cm from the incisors.                           Patchy mild inflammation characterized by                            congestion (edema), erythema and friability was                            found in the entire examined stomach.                           The cardia and gastric fundus were normal on                            retroflexion.                           The examined duodenum was normal. Complications:            No immediate complications. Estimated Blood Loss:     Estimated blood loss was minimal. Impression:               - Benign-appearing esophageal stenosis, dilated                            with endoscope during insertion.                           - LA Grade B reflux esophagitis with no bleeding.                           - Alcoholic gastritis.                           -  Normal examined duodenum.                           - No specimens collected. Recommendation:           - Full liquid diet today, then advance as tolerated                            to mechanical soft diet.                           - Continue present medications.                           - Follow an antireflux regimen.                           -  Use Prilosec (omeprazole) 40 mg PO BID. Rx fore                            90 days with 3 refills                           - Use sucralfate suspension 1 gram PO BID . Rx for                            4 weeks.                           - Alcohol cessation                           - Follow up in GI office next available appointment                            for abnormal LFT (alcohol induced liver injury) and                            will plan for repeat EGD with dilation in 2-3 months Mauri Pole, MD 06/26/2022 10:45:37 AM This report has been signed electronically.

## 2022-06-26 NOTE — Progress Notes (Signed)
To pacu, VSS. Report to [email or web address] 

## 2022-06-29 ENCOUNTER — Telehealth: Payer: Self-pay

## 2022-06-29 NOTE — Telephone Encounter (Signed)
  Follow up Call-     06/26/2022    9:38 AM 02/20/2022    9:26 AM  Call back number  Post procedure Call Back phone  # [phone]  Permission to leave phone message Yes Yes     Patient questions:  Do you have a fever, pain , or abdominal swelling? No. Pain Score  0 *  Have you tolerated food without any problems? Yes.    Have you been able to return to your normal activities? Yes.    Do you have any questions about your discharge instructions: Diet   No. Medications  No. Follow up visit  No.  Do you have questions or concerns about your Care? No.  Actions: * If pain score is 4 or above: No action needed, pain <4.

## 2022-07-02 ENCOUNTER — Ambulatory Visit: Payer: Medicare Other | Admitting: Gastroenterology

## 2022-07-07 ENCOUNTER — Other Ambulatory Visit: Payer: Self-pay

## 2022-07-25 ENCOUNTER — Other Ambulatory Visit: Payer: Self-pay | Admitting: Gastroenterology

## 2022-08-20 NOTE — Progress Notes (Signed)
Ana Graves    [id number]    1955-08-08  Primary Care Physician:Varadarajan, Soyla Murphy, MD  Referring Physician: Lorenda Ishihara, MD [address] [place],  [place] [address]   Chief complaint: GERD, erosive esophagitis Chief Complaint  Patient presents with   stricture and stenosis of esophagus    To discuss having a EGD   elevated LFT's   HPI: 67 year old very pleasant female with history of esophageal stricture, and erosive esophagitis here for follow-up visit .  I last saw her on 06-05-22. At that time, she was feeling well overall. She had completed Carafate and omeprazole, currently not on any acid suppression.   Today, she reports feeling well overall. She states she would eat meat and chicken occasionally and states that she does eat vegetables regularly. She states that she's been chewing her food thoroughly.She states she is no longer vomiting. She reports taking Zoloft 50 mg 2x daily typically in the morning. She states that she no longer takes sucralfate.   She complains of mild anxiety that affect her stomach and reports taking Tums to help improve her symptoms.   She also reports taking a vitamin D 125 mcg supplements.   She denies diarrhea, constipation, nausea, blood in stool, black stool, vomiting, abdominal pain, bloating, unintentional weight loss, or reflux.  She reports stopping alcohol use.  GI Hx: EGD 06-26-22  - Benign-appearing esophageal stenosis, dilated with endoscope during insertion. - LA Grade B reflux esophagitis with no bleeding. - Alcoholic gastritis. - Normal examined duodenum. - No specimens collected.  EGD 04/21/2022: Esophageal stricture dilated. Esophageal biopsies consistent with acid reflux esophagitis negative for EOE  EGD 02/20/2022: Proximal esophageal stricture  US Abdomen Limited RUQ 01-15-22 No cholelithiasis or sonographic evidence for acute cholecystitis Increased hepatic parenchymal  echogenicity suggestive of steatosis.   Colonoscopy 05/03/2019: 3 dimunitive tubular adenomas were removed  colonoscopy 03/20/2016: 4 sessile diminutive polyps [2 tubular adenomas 1 sessile serrated adenoma and hyperplastic polyp] and internal hemorrhoids.   Current Outpatient Medications:    Calcium Carbonate Antacid (TUMS CHEWY DELIGHTS) 1177 MG CHEW, Chew 1-2 tablets by mouth as needed., Disp: , Rfl:    HUMIRA PEN 40 MG/0.4ML PNKT, Inject 40 mg into the skin every 14 (fourteen) days., Disp: , Rfl:    metroNIDAZOLE (METROCREAM) 0.75 % cream, Apply 1 Application topically 2 (two) times daily., Disp: , Rfl:    Multiple Vitamin (MULTIVITAMIN) tablet, Take 1 tablet by mouth daily., Disp: , Rfl:    omeprazole (PRILOSEC) 40 MG capsule, Take 1 capsule (40 mg total) by mouth 2 (two) times daily before a meal. Take on an empty stomach, Disp: 60 capsule, Rfl: 11   sertraline (ZOLOFT) 50 MG tablet, Take 50 mg by mouth daily., Disp: , Rfl:    triamcinolone cream (KENALOG) 0.1 %, , Disp: , Rfl:    sucralfate (CARAFATE) 1 GM/10ML suspension, TAKE 10ML'S TWICE A DAY FOR 28 DAYS (Patient not taking: Reported on 09/02/2022), Disp: 560 mL, Rfl: 0    Allergies as of 09/02/2022 - Review Complete 09/02/2022  Allergen Reaction Noted   Doxycycline Nausea And Vomiting 06/05/2022   Hydroxychloroquine Nausea And Vomiting 06/05/2022   Other  09/02/2022    Past Medical History:  Diagnosis Date   Anxiety    Arthritis    Rhuematoid arthritis   Blood transfusion without reported diagnosis 2007   Cancer 2012   skin cancer; face   Complication of anesthesia    airway  obstruction during endo procedure   Elevated LFTs    GERD (gastroesophageal reflux disease)    Hemorrhoids    History of colon polyps    Hypoglycemia    Rosacea     Past Surgical History:  Procedure Laterality Date   BIOPSY  04/21/2022   Procedure: BIOPSY;  Surgeon: Napoleon FormNandigam, Leina Babe V, MD;  Location: [facility];  Service:  Gastroenterology;;   ESOPHAGOGASTRODUODENOSCOPY N/A 04/21/2022   Procedure: ESOPHAGOGASTRODUODENOSCOPY (EGD);  Surgeon: Napoleon FormNandigam, Halima Fogal V, MD;  [facility];  Service: Gastroenterology;  Laterality: N/A;   KNEE ARTHROSCOPY Left 1994   MOHS SURGERY  2012   OTHER SURGICAL HISTORY  1996   surgery for skin cancer   TUBAL LIGATION  1995   UTERINE FIBROID SURGERY  2004    Family History  Problem Relation Age of Onset   Stomach cancer Mother    Rectal cancer Sister    Colon cancer Neg Hx    Esophageal cancer Neg Hx     Social History   Socioeconomic History   Marital status: Married    Spouse name: Not on file   Number of children: Not on file   Years of education: Not on file   Highest education level: Not on file  Occupational History   Not on file  Tobacco Use   Smoking status: Never   Smokeless tobacco: Never  Vaping Use   Vaping Use: Never used  Substance and Sexual Activity   Alcohol use: Not Currently    Alcohol/week: 21.0 standard drinks of alcohol    Types: 21 Glasses of wine per week    Comment: pt states she stopped drinking 07/21/2022   Drug use: No   Sexual activity: Not on file  Other Topics Concern   Not on file  Social History Narrative   Not on file   Social Determinants of Health   Financial Resource Strain: Not on file  Food Insecurity: Not on file  Transportation Needs: Not on file  Physical Activity: Not on file  Stress: Not on file  Social Connections: Not on file  Intimate Partner Violence: Not on file    Review of systems: Review of Systems  Constitutional:  Negative for unexpected weight change.  HENT:  Positive for trouble swallowing.   Gastrointestinal:  Negative for abdominal distention, abdominal pain, anal bleeding, blood in stool, constipation, diarrhea, nausea and vomiting.    Physical Exam: General: well-appearing   Eyes: sclera anicteric, no redness ENT: oral mucosa moist without lesions, no cervical or  supraclavicular lymphadenopathy CV: RRR, no JVD, no peripheral edema Resp: clear to auscultation bilaterally, normal RR and effort noted GI: soft, no tenderness, with active bowel sounds. No guarding or palpable organomegaly noted. Skin; warm and dry, no rash or jaundice noted Neuro: awake, alert and oriented x 3. Normal gross motor function and fluent speech   Data Reviewed:  Reviewed labs, radiology imaging, old records and pertinent past GI work up   Assessment and Plan/Recommendations:  67 year old very pleasant female with severe erosive esophagitis and multiple esophageal strictures, biopsies negative for EOE Scheduled for EGD to document healing of mucosa and obtain additional biopsies if needed and esophageal dilation as needed Omeprazole 20 mg twice daily, before breakfast and dinner Continue antireflux measures  History of multiple adenomatous colon polyps: Due for surveillance colonoscopy December 2023, will schedule it once erosive esophagitis resolves so she will be able to tolerate the bowel prep Return after EGD   -decrease Zoloft to once daily -Continue  to chew food  thoroughly -Advised to eat dinner few hours before sleep and raise head and upper body when sleeping.  -[place] and [place]  -follow up in 6 months   This visit required 40 minutes of patient care (this includes precharting, chart review, review of results, face-to-face time used for counseling as well as treatment plan and follow-up. The patient was provided an opportunity to ask questions and all were answered. The patient agreed with the plan and demonstrated an understanding of the instructions.   I,Safa M Kadhim,acting as a scribe for Marsa ArisKavitha Jamyla Ard, MD.,have documented all relevant documentation on the behalf of Marsa ArisKavitha Jago Carton, MD,as directed by  Marsa ArisKavitha Jalen Oberry, MD while in the presence of Marsa ArisKavitha Evalyse Stroope, MD.   I, Marsa ArisKavitha Terique Kawabata, MD, have reviewed all documentation for this visit. The  documentation on 09/02/22 for the exam, diagnosis, procedures, and orders are all accurate and complete.   Iona BeardK. Veena Osric Klopf , MD    CC: Lorenda IshiharaVaradarajan, Rupashree,*

## 2022-08-27 ENCOUNTER — Other Ambulatory Visit: Payer: Self-pay | Admitting: Gastroenterology

## 2022-09-02 ENCOUNTER — Ambulatory Visit (INDEPENDENT_AMBULATORY_CARE_PROVIDER_SITE_OTHER): Payer: Medicare Other | Admitting: Gastroenterology

## 2022-09-02 ENCOUNTER — Encounter: Payer: Self-pay | Admitting: Gastroenterology

## 2022-09-02 VITALS — BP 94/62 | HR 88 | Ht 64.0 in | Wt 121.5 lb

## 2022-09-02 DIAGNOSIS — K222 Esophageal obstruction: Secondary | ICD-10-CM

## 2022-09-02 DIAGNOSIS — R7989 Other specified abnormal findings of blood chemistry: Secondary | ICD-10-CM | POA: Diagnosis not present

## 2022-09-02 DIAGNOSIS — K219 Gastro-esophageal reflux disease without esophagitis: Secondary | ICD-10-CM | POA: Diagnosis not present

## 2022-09-02 DIAGNOSIS — R945 Abnormal results of liver function studies: Secondary | ICD-10-CM | POA: Diagnosis not present

## 2022-09-02 MED ORDER — OMEPRAZOLE 40 MG PO CPDR: 40.0000 mg | DELAYED_RELEASE_CAPSULE | Freq: Every day | ORAL | 3 refills | Status: DC

## 2022-09-02 NOTE — Patient Instructions (Addendum)
You will need labs drawn in 3 months  Follow up in 6 months, you will need to call the office back for that appointment  Decrease omeprazole to 40 mg daily     Take IBGard and FDGard as needed three times a day   _______________________________________________________  If your blood pressure at your visit was 140/90 or greater, please contact your primary care physician to follow up on this.  _______________________________________________________  If you are age 50 or older, your body mass index should be between 23-30. Your Body mass index is 20.86 kg/m. If this is out of the aforementioned range listed, please consider follow up with your Primary Care Provider.  If you are age 23 or younger, your body mass index should be between 19-25. Your Body mass index is 20.86 kg/m. If this is out of the aformentioned range listed, please consider follow up with your Primary Care Provider.   ________________________________________________________  The [place] providers would like to encourage you to use [facility] to communicate with providers for non-urgent requests or questions.  Due to long hold times on the telephone, sending your provider a message by [facility] may be a faster and more efficient way to get a response.  Please allow 48 business hours for a response.  Please remember that this is for non-urgent requests.  _______________________________________________________   I appreciate the  opportunity to care for you  Thank You   Marsa Aris , MD

## 2022-09-03 ENCOUNTER — Telehealth: Payer: Self-pay | Admitting: *Deleted

## 2022-09-03 ENCOUNTER — Encounter: Payer: Self-pay | Admitting: *Deleted

## 2022-09-03 DIAGNOSIS — Z86010 Personal history of colonic polyps: Secondary | ICD-10-CM

## 2022-09-03 MED ORDER — PLENVU 140 G PO SOLR: ORAL | 0 refills | Status: DC

## 2022-09-03 NOTE — Telephone Encounter (Signed)
Printed patients instructions for colonoscopy prep for 10/08/2022 at 8:00 am. Mailed instructions today  Spoke with patient she will call me with questions once she receives the paperwork and will sign consent the day of her procedure.

## 2022-09-09 LAB — LAB REPORT - SCANNED: EGFR: 100

## 2022-10-03 ENCOUNTER — Encounter: Payer: Self-pay | Admitting: Certified Registered Nurse Anesthetist

## 2022-10-08 ENCOUNTER — Ambulatory Visit (AMBULATORY_SURGERY_CENTER): Payer: Medicare Other | Admitting: Gastroenterology

## 2022-10-08 ENCOUNTER — Encounter: Payer: Self-pay | Admitting: Gastroenterology

## 2022-10-08 VITALS — BP 118/78 | HR 79 | Temp 98.6°F | Resp 21 | Ht 64.0 in | Wt 121.0 lb

## 2022-10-08 DIAGNOSIS — D123 Benign neoplasm of transverse colon: Secondary | ICD-10-CM | POA: Diagnosis not present

## 2022-10-08 DIAGNOSIS — Z860100 Personal history of colon polyps, unspecified: Secondary | ICD-10-CM

## 2022-10-08 DIAGNOSIS — K635 Polyp of colon: Secondary | ICD-10-CM

## 2022-10-08 DIAGNOSIS — Z09 Encounter for follow-up examination after completed treatment for conditions other than malignant neoplasm: Secondary | ICD-10-CM | POA: Diagnosis not present

## 2022-10-08 MED ORDER — SODIUM CHLORIDE 0.9 % IV SOLN: 500.0000 mL | INTRAVENOUS | Status: DC

## 2022-10-08 NOTE — Op Note (Signed)
[facility] Patient Name: Ana Graves Procedure Date: 10/08/2022 7:17 AM MRN: [id number] Endoscopist: Napoleon Form , MD, [id number] Age: 67 Referring MD:  Date of Birth: 08-Jul-1955 Gender: Female Account #: [id number] Procedure:                Colonoscopy Indications:              High risk colon cancer surveillance: Personal                            history of multiple (3 or more) adenomas, High risk                            colon cancer surveillance: Personal history of                            adenoma less than 10 mm in size Medicines:                Monitored Anesthesia Care Procedure:                Pre-Anesthesia Assessment:                           - Prior to the procedure, a History and Physical                            was performed, and patient medications and                            allergies were reviewed. The patient's tolerance of                            previous anesthesia was also reviewed. The risks                            and benefits of the procedure and the sedation                            options and risks were discussed with the patient.                            All questions were answered, and informed consent                            was obtained. Prior Anticoagulants: The patient has                            taken no anticoagulant or antiplatelet agents. ASA                            Grade Assessment: II - A patient with mild systemic                            disease. After reviewing the risks and benefits,  the patient was deemed in satisfactory condition to                            undergo the procedure.                           After obtaining informed consent, the colonoscope                            was passed under direct vision. Throughout the                            procedure, the patient's blood pressure, pulse, and                            oxygen saturations  were monitored continuously. The                            Olympus PCF-H190DL (#[id number]) Colonoscope was                            introduced through the anus and advanced to the the                            cecum, identified by appendiceal orifice and                            ileocecal valve. The colonoscopy was performed                            without difficulty. The patient tolerated the                            procedure well. The quality of the bowel                            preparation was good. The ileocecal valve,                            appendiceal orifice, and rectum were photographed. Scope In: 7:41:42 AM Scope Out: 7:57:19 AM Scope Withdrawal Time: 0 hours 11 minutes 56 seconds  Total Procedure Duration: 0 hours 15 minutes 37 seconds  Findings:                 The perianal and digital rectal examinations were                            normal.                           An 8 mm polyp was found in the transverse colon.                            The polyp was sessile. The polyp was removed with a  cold snare. Resection and retrieval were complete.                           Non-bleeding external and internal hemorrhoids were                            found during retroflexion. The hemorrhoids were                            small.                           The exam was otherwise without abnormality. Complications:            No immediate complications. Estimated Blood Loss:     Estimated blood loss was minimal. Impression:               - One 8 mm polyp in the transverse colon, removed                            with a cold snare. Resected and retrieved.                           - Non-bleeding external and internal hemorrhoids.                           - The examination was otherwise normal. Recommendation:           - Patient has a contact number available for                            emergencies. The signs and symptoms of  potential                            delayed complications were discussed with the                            patient. Return to normal activities tomorrow.                            Written discharge instructions were provided to the                            patient.                           - Resume previous diet.                           - Continue present medications.                           - Await pathology results.                           - Repeat colonoscopy in 5 years for surveillance  based on pathology results.                           - Return to GI clinic in 6 months. Napoleon Form, MD 10/08/2022 8:08:15 AM This report has been signed electronically.

## 2022-10-08 NOTE — Progress Notes (Signed)
[facility] History and Physical   Primary Care Physician:  Lorenda Ishihara, MD   Reason for Procedure:  History of adenomatous colon polyps  Plan:    Surveillance colonoscopy with possible interventions as needed     HPI: Ana Graves is a very pleasant 67 y.o. female here for surveillance colonoscopy. Denies any nausea, vomiting, abdominal pain, melena or bright red blood per rectum  The risks and benefits as well as alternatives of endoscopic procedure(s) have been discussed and reviewed. All questions answered. The patient agrees to proceed.    Past Medical History:  Diagnosis Date   Anxiety    Arthritis    Rhuematoid arthritis   Blood transfusion without reported diagnosis 2007   [facility]) 2012   skin cancer; face   Complication of anesthesia    airway obstruction during endo procedure   Elevated LFTs    GERD (gastroesophageal reflux disease)    Hemorrhoids    History of colon polyps    Hypoglycemia    Rosacea     Past Surgical History:  Procedure Laterality Date   BIOPSY  04/21/2022   Procedure: BIOPSY;  Surgeon: Napoleon Form, MD;  Location: [facility];  Service: Gastroenterology;;   ESOPHAGOGASTRODUODENOSCOPY N/A 04/21/2022   Procedure: ESOPHAGOGASTRODUODENOSCOPY (EGD);  Surgeon: Napoleon Form, MD;  Location: [facility];  Service: Gastroenterology;  Laterality: N/A;   KNEE ARTHROSCOPY Left 1994   MOHS SURGERY  2012   OTHER SURGICAL HISTORY  1996   surgery for skin cancer   TUBAL LIGATION  1995   UTERINE FIBROID SURGERY  2004    Prior to Admission medications   Medication Sig Start Date End Date Taking? Authorizing Provider  cefdinir (OMNICEF) 300 MG capsule Take 300 mg by mouth 2 (two) times daily.   Yes [provider]  sertraline (ZOLOFT) 50 MG tablet Take 50 mg by mouth daily.   Yes [provider]  Calcium Carbonate Antacid (TUMS CHEWY DELIGHTS) 1177 MG CHEW Chew 1-2 tablets by mouth  as needed. 01/15/20   [provider]  HUMIRA PEN 40 MG/0.4ML PNKT Inject 40 mg into the skin every 14 (fourteen) days. 02/02/22   [provider]  metroNIDAZOLE (METROCREAM) 0.75 % cream Apply 1 Application topically 2 (two) times daily. 08/26/22   [provider]  Multiple Vitamin (MULTIVITAMIN) tablet Take 1 tablet by mouth daily. Patient not taking: Reported on 10/08/2022    [provider]  omeprazole (PRILOSEC) 40 MG capsule Take 1 capsule (40 mg total) by mouth daily. 09/02/22   Napoleon Form, MD  sucralfate (CARAFATE) 1 GM/10ML suspension TAKE 10ML'S TWICE A DAY FOR 28 DAYS Patient not taking: Reported on 09/02/2022 08/27/22   Napoleon Form, MD  triamcinolone cream (KENALOG) 0.1 %  06/18/22   [provider]    Current Outpatient Medications  Medication Sig Dispense Refill   cefdinir (OMNICEF) 300 MG capsule Take 300 mg by mouth 2 (two) times daily.     sertraline (ZOLOFT) 50 MG tablet Take 50 mg by mouth daily.     Calcium Carbonate Antacid (TUMS CHEWY DELIGHTS) 1177 MG CHEW Chew 1-2 tablets by mouth as needed.     HUMIRA PEN 40 MG/0.4ML PNKT Inject 40 mg into the skin every 14 (fourteen) days.     metroNIDAZOLE (METROCREAM) 0.75 % cream Apply 1 Application topically 2 (two) times daily.     Multiple Vitamin (MULTIVITAMIN) tablet Take 1 tablet by mouth daily. (Patient not taking: Reported on 10/08/2022)  omeprazole (PRILOSEC) 40 MG capsule Take 1 capsule (40 mg total) by mouth daily. 90 capsule 3   sucralfate (CARAFATE) 1 GM/10ML suspension TAKE 10ML'S TWICE A DAY FOR 28 DAYS (Patient not taking: Reported on 09/02/2022) 560 mL 0   triamcinolone cream (KENALOG) 0.1 %      Current Facility-Administered Medications  Medication Dose Route Frequency Provider Last Rate Last Admin   0.9 %  sodium chloride infusion  500 mL Intravenous Continuous Edy Belt, Eleonore Chiquito, MD        Allergies as of 10/08/2022 - Review Complete 10/08/2022   Allergen Reaction Noted   Doxycycline Nausea And Vomiting 06/05/2022   Hydroxychloroquine Nausea And Vomiting 06/05/2022   Other  09/02/2022    Family History  Problem Relation Age of Onset   Stomach cancer Mother    Rectal cancer Sister    Colon cancer Neg Hx    Esophageal cancer Neg Hx     Social History   Socioeconomic History   Marital status: Married    Spouse name: Not on file   Number of children: Not on file   Years of education: Not on file   Highest education level: Not on file  Occupational History   Not on file  Tobacco Use   Smoking status: Never   Smokeless tobacco: Never  Vaping Use   Vaping Use: Never used  Substance and Sexual Activity   Alcohol use: Not Currently    Alcohol/week: 21.0 standard drinks of alcohol    Types: 21 Glasses of wine per week    Comment: pt states she stopped drinking 07/21/2022   Drug use: No   Sexual activity: Not on file  Other Topics Concern   Not on file  Social History Narrative   Not on file   Social Determinants of Health   Financial Resource Strain: Not on file  Food Insecurity: Not on file  Transportation Needs: Not on file  Physical Activity: Not on file  Stress: Not on file  Social Connections: Not on file  Intimate Partner Violence: Not on file    Review of Systems:  All other review of systems negative except as mentioned in the HPI.  Physical Exam: Vital signs in last 24 hours: Blood Pressure 124/84   Pulse 79   Temperature 98.6 F (37 C)   Respiration 10   Height 5\' 4"  (1.626 m)   Weight 121 lb (54.9 kg)   Oxygen Saturation 93%   Body Mass Index 20.77 kg/m  General:   Alert, NAD Lungs:  Clear .   Heart:  Regular rate and rhythm Abdomen:  Soft, nontender and nondistended. Neuro/Psych:  Alert and cooperative. Normal mood and affect. A and O x 3  Reviewed labs, radiology imaging, old records and pertinent past GI work up  Patient is appropriate for planned procedure(s) and anesthesia in  an ambulatory setting   K. Scherry Ran , MD [phone]

## 2022-10-08 NOTE — Progress Notes (Signed)
Pt's states no medical or surgical changes since previsit or office visit. 

## 2022-10-08 NOTE — Progress Notes (Signed)
Called to room to assist during endoscopic procedure.  Patient ID and intended procedure confirmed with present staff. Received instructions for my participation in the procedure from the performing physician.  

## 2022-10-08 NOTE — Patient Instructions (Addendum)
-   Resume previous diet. - Continue present medications. - Await pathology results. - Repeat colonoscopy in 5 years for surveillance based on pathology results. - Return to GI clinic in 6 months.  YOU HAD AN ENDOSCOPIC PROCEDURE TODAY AT THE [facility]:   Refer to the procedure report that was given to you for any specific questions about what was found during the examination.  If the procedure report does not answer your questions, please call your gastroenterologist to clarify.  If you requested that your care partner not be given the details of your procedure findings, then the procedure report has been included in a sealed envelope for you to review at your convenience later.  YOU SHOULD EXPECT: Some feelings of bloating in the abdomen. Passage of more gas than usual.  Walking can help get rid of the air that was put into your GI tract during the procedure and reduce the bloating. If you had a lower endoscopy (such as a colonoscopy or flexible sigmoidoscopy) you may notice spotting of blood in your stool or on the toilet paper. If you underwent a bowel prep for your procedure, you may not have a normal bowel movement for a few days.  Please Note:  You might notice some irritation and congestion in your nose or some drainage.  This is from the oxygen used during your procedure.  There is no need for concern and it should clear up in a day or so.  SYMPTOMS TO REPORT IMMEDIATELY:  Following lower endoscopy (colonoscopy or flexible sigmoidoscopy):  Excessive amounts of blood in the stool  Significant tenderness or worsening of abdominal pains  Swelling of the abdomen that is new, acute  Fever of 100F or higher  For urgent or emergent issues, a gastroenterologist can be reached at any hour by calling [phone]. Do not use MyChart messaging for urgent concerns.    DIET:  We do recommend a small meal at first, but then you may proceed to your regular diet.  Drink plenty of  fluids but you should avoid alcoholic beverages for 24 hours.  ACTIVITY:  You should plan to take it easy for the rest of today and you should NOT DRIVE or use heavy machinery until tomorrow (because of the sedation medicines used during the test).    FOLLOW UP: Our staff will call the number listed on your records the next business day following your procedure.  We will call around 7:15- 8:00 am to check on you and address any questions or concerns that you may have regarding the information given to you following your procedure. If we do not reach you, we will leave a message.     If any biopsies were taken you will be contacted by phone or by letter within the next 1-3 weeks.  Please call us at [phone] if you have not heard about the biopsies in 3 weeks.    SIGNATURES/CONFIDENTIALITY: You and/or your care partner have signed paperwork which will be entered into your electronic medical record.  These signatures attest to the fact that that the information above on your After Visit Summary has been reviewed and is understood.  Full responsibility of the confidentiality of this discharge information lies with you and/or your care-partner.

## 2022-10-08 NOTE — Progress Notes (Signed)
Report given to PACU, vss 

## 2022-10-09 ENCOUNTER — Telehealth: Payer: Self-pay

## 2022-10-09 NOTE — Telephone Encounter (Signed)
  Follow up Call-     10/08/2022    7:17 AM 06/26/2022    9:38 AM 02/20/2022    9:26 AM  Call back number  Post procedure Call Back phone  # [phone]  Permission to leave phone message Yes Yes Yes     Patient questions:  Do you have a fever, pain , or abdominal swelling? No. Pain Score  0 *  Have you tolerated food without any problems? Yes.    Have you been able to return to your normal activities? Yes.    Do you have any questions about your discharge instructions: Diet   No. Medications  No. Follow up visit  No.  Do you have questions or concerns about your Care? No.  Actions: * If pain score is 4 or above: No action needed, pain <4.

## 2022-10-12 ENCOUNTER — Encounter: Payer: Self-pay | Admitting: Gastroenterology

## 2022-11-23 ENCOUNTER — Other Ambulatory Visit: Payer: Self-pay

## 2022-12-17 ENCOUNTER — Other Ambulatory Visit (HOSPITAL_COMMUNITY): Payer: Self-pay

## 2022-12-17 DIAGNOSIS — I272 Pulmonary hypertension, unspecified: Secondary | ICD-10-CM

## 2023-01-22 ENCOUNTER — Other Ambulatory Visit: Payer: Self-pay | Admitting: Obstetrics & Gynecology

## 2023-01-22 DIAGNOSIS — Z780 Asymptomatic menopausal state: Secondary | ICD-10-CM

## 2023-01-22 DIAGNOSIS — E2839 Other primary ovarian failure: Secondary | ICD-10-CM

## 2023-03-04 ENCOUNTER — Other Ambulatory Visit (HOSPITAL_COMMUNITY): Payer: Self-pay

## 2023-03-04 ENCOUNTER — Ambulatory Visit (HOSPITAL_COMMUNITY)
Admission: RE | Admit: 2023-03-04 | Discharge: 2023-03-04 | Disposition: A | Discharge status: Home / Self Care | Payer: Medicare Other | Source: Ambulatory Visit | Attending: Cardiology | Admitting: Cardiology

## 2023-03-04 ENCOUNTER — Ambulatory Visit (HOSPITAL_COMMUNITY)
Admission: RE | Admit: 2023-03-04 | Discharge: 2023-03-04 | Disposition: A | Discharge status: Home / Self Care | Payer: Medicare Other | Source: Ambulatory Visit | Attending: Internal Medicine | Admitting: Internal Medicine

## 2023-03-04 ENCOUNTER — Encounter (HOSPITAL_COMMUNITY): Payer: Self-pay | Admitting: Cardiology

## 2023-03-04 VITALS — BP 104/70 | HR 84 | Wt 125.8 lb

## 2023-03-04 DIAGNOSIS — I272 Pulmonary hypertension, unspecified: Secondary | ICD-10-CM

## 2023-03-04 DIAGNOSIS — R234 Changes in skin texture: Secondary | ICD-10-CM | POA: Diagnosis not present

## 2023-03-04 DIAGNOSIS — I7300 Raynaud's syndrome without gangrene: Secondary | ICD-10-CM | POA: Diagnosis not present

## 2023-03-04 DIAGNOSIS — I110 Hypertensive heart disease with heart failure: Secondary | ICD-10-CM | POA: Insufficient documentation

## 2023-03-04 DIAGNOSIS — M069 Rheumatoid arthritis, unspecified: Secondary | ICD-10-CM | POA: Diagnosis not present

## 2023-03-04 DIAGNOSIS — I509 Heart failure, unspecified: Secondary | ICD-10-CM | POA: Insufficient documentation

## 2023-03-04 DIAGNOSIS — M059 Rheumatoid arthritis with rheumatoid factor, unspecified: Secondary | ICD-10-CM

## 2023-03-04 DIAGNOSIS — I10 Essential (primary) hypertension: Secondary | ICD-10-CM

## 2023-03-04 DIAGNOSIS — K2101 Gastro-esophageal reflux disease with esophagitis, with bleeding: Secondary | ICD-10-CM

## 2023-03-04 DIAGNOSIS — R1319 Other dysphagia: Secondary | ICD-10-CM

## 2023-03-04 DIAGNOSIS — Z79899 Other long term (current) drug therapy: Secondary | ICD-10-CM | POA: Insufficient documentation

## 2023-03-04 DIAGNOSIS — R748 Abnormal levels of other serum enzymes: Secondary | ICD-10-CM | POA: Insufficient documentation

## 2023-03-04 LAB — PULMONARY FUNCTION TEST
DL/VA % pred: 73 %
DL/VA: 3.05 ml/min/mmHg/L
DLCO unc % pred: 77 %
DLCO unc: 15 ml/min/mmHg
FEF 25-75 Pre: 2.58 L/s
FEF2575-%Pred-Pre: 128 %
FEV1-%Pred-Pre: 119 %
FEV1-Pre: 2.76 L
FEV1FVC-%Pred-Pre: 103 %
FEV6-%Pred-Pre: 118 %
FEV6-Pre: 3.44 L
FEV6FVC-%Pred-Pre: 102 %
FVC-%Pred-Pre: 114 %
FVC-Pre: 3.48 L
Pre FEV1/FVC ratio: 79 %
Pre FEV6/FVC Ratio: 99 %
RV % pred: 81 %
RV: 1.71 L
TLC % pred: 102 %
TLC: 5.11 L

## 2023-03-04 LAB — CBC WITH DIFFERENTIAL/PLATELET
Abs Immature Granulocytes: 0.02 10*3/uL (ref 0.00–0.07)
Basophils Absolute: 0.1 10*3/uL (ref 0.0–0.1)
Basophils Relative: 1 %
Eosinophils Absolute: 0.1 10*3/uL (ref 0.0–0.5)
Eosinophils Relative: 1 %
HCT: 39.1 % (ref 36.0–46.0)
Hemoglobin: 13.7 g/dL (ref 12.0–15.0)
Immature Granulocytes: 0 %
Lymphocytes Relative: 18 %
Lymphs Abs: 1.6 10*3/uL (ref 0.7–4.0)
MCH: 36.1 pg — ABNORMAL HIGH (ref 26.0–34.0)
MCHC: 35 g/dL (ref 30.0–36.0)
MCV: 103.2 fL — ABNORMAL HIGH (ref 80.0–100.0)
Monocytes Absolute: 0.9 10*3/uL (ref 0.1–1.0)
Monocytes Relative: 10 %
Neutro Abs: 6.1 10*3/uL (ref 1.7–7.7)
Neutrophils Relative %: 70 %
Platelets: 165 10*3/uL (ref 150–400)
RBC: 3.79 MIL/uL — ABNORMAL LOW (ref 3.87–5.11)
RDW: 12.5 % (ref 11.5–15.5)
WBC: 8.8 10*3/uL (ref 4.0–10.5)
nRBC: 0 % (ref 0.0–0.2)

## 2023-03-04 LAB — ECHOCARDIOGRAM COMPLETE
AR max vel: 1.67 cm2
AV Area VTI: 1.74 cm2
AV Area mean vel: 1.7 cm2
AV Mean grad: 5.5 mm[Hg]
AV Peak grad: 9.2 mm[Hg]
Ao pk vel: 1.52 m/s
Area-P 1/2: 3.77 cm2
Calc EF: 64.2 %
S' Lateral: 3.7 cm
Single Plane A2C EF: 62.4 %
Single Plane A4C EF: 65.7 %

## 2023-03-04 LAB — BASIC METABOLIC PANEL
Anion gap: 14 (ref 5–15)
BUN: <5 mg/dL — ABNORMAL LOW (ref 8–23)
CO2: 25 mmol/L (ref 22–32)
Calcium: 10.1 mg/dL (ref 8.9–10.3)
Chloride: 101 mmol/L (ref 98–111)
Creatinine, Ser: 0.6 mg/dL (ref 0.44–1.00)
GFR, Estimated: >60 mL/min (ref >60)
Glucose, Bld: 115 mg/dL — ABNORMAL HIGH (ref 70–99)
Potassium: 4.8 mmol/L (ref 3.5–5.1)
Sodium: 140 mmol/L (ref 135–145)

## 2023-03-04 MED ORDER — ALBUTEROL SULFATE (2.5 MG/3ML) 0.083% IN NEBU: 2.5000 mg | INHALATION_SOLUTION | Freq: Once | RESPIRATORY_TRACT | Status: DC

## 2023-03-04 NOTE — Progress Notes (Signed)
Orders entered for right heart cath with Dr. Gasper Lloyd

## 2023-03-04 NOTE — Patient Instructions (Addendum)
Labs done today. We will contact you only if your labs are abnormal.  No other medication changes were made. Please continue all current medications as prescribed.  Your physician recommends that you schedule a follow-up appointment in:    If you have any questions or concerns before your next appointment please send [place] a message through [place] or call our office at [phone].    TO LEAVE A MESSAGE FOR THE NURSE SELECT OPTION 2, PLEASE LEAVE A MESSAGE INCLUDING: YOUR NAME DATE OF BIRTH CALL BACK NUMBER REASON FOR CALL**this is important as we prioritize the call backs  YOU WILL RECEIVE A CALL BACK THE SAME DAY AS LONG AS YOU CALL BEFORE 4:00 PM   Do the following things EVERYDAY: Weigh yourself in the morning before breakfast. Write it down and keep it in a log. Take your medicines as prescribed Eat low salt foods--Limit salt (sodium) to 2000 mg per day.  Stay as active as you can everyday Limit all fluids for the day to less than 2 liters   At the [facility], you and your health needs are our priority. As part of our continuing mission to provide you with exceptional heart care, we have created designated Provider Care Teams. These Care Teams include your primary Cardiologist (physician) and Advanced Practice Providers (APPs- Physician Assistants and Nurse Practitioners) who all work together to provide you with the care you need, when you need it.   You may see any of the following providers on your designated Care Team at your next follow up: Dr Arvilla Meres Dr Marca Ancona Dr. Marcos Eke, NP Robbie Lis, [place] [facility] [place], [place] Brynda Peon, NP Karle Plumber, PharmD   Please be sure to bring in all your medications bottles to every appointment.    Thank you for choosing [facility]     You are scheduled for a Cardiac Catheterization on Thursday, October 31 with Dr.   Dorthula Nettles .  1. Please arrive at the [facility] (Main Entrance A) at [facility]: [address] [place], [place] [address] at 8:30 AM (This time is 2 hour(s) before your procedure to ensure your preparation). Free valet parking service is available. You will check in at ADMITTING. The support person will be asked to wait in the waiting room.  It is OK to have someone drop you off and come back when you are ready to be discharged.    Special note: Every effort is made to have your procedure done on time. Please understand that emergencies sometimes delay scheduled procedures.  2. Diet: Do not eat solid foods after midnight.  The patient may have clear liquids until 5am upon the day of the procedure.  3. Medication instructions in preparation for your procedure: NONE  On the morning of your procedure, take any morning medicines NOT listed above.  You may use sips of water.  5. Plan to go home the same day, you will only stay overnight if medically necessary. 6. Bring a current list of your medications and current insurance cards. 7. You MUST have a responsible person to drive you home. 8. Someone MUST be with you the first 24 hours after you arrive home or your discharge will be delayed. 9. Please wear clothes that are easy to get on and off and wear slip-on shoes.  Thank you for allowing [place] to care for you!   -- [facility]

## 2023-03-04 NOTE — Progress Notes (Signed)
ADVANCED HEART FAILURE CLINIC NOTE  Referring Physician: Lorenda Ishihara,*  Primary Care: [facility] [place]  HPI: Ana Graves is a 67 y.o. female with rheumatoid arthritis, Raynaud's currently on Humira presenting today for evaluation of pulmonary hypertension.  Ms. Ana Graves reports that roughly 5-56yrs ago she was diagnosed with RA but did not start therapy. In 2023 she started to follow more closely with rheumatology and was started on Humira. She has had improvement in RA symptoms and joint swelling since that time.  After recent appointment with Dr. Dierdre Forth due to concern for scleroderma patient was scheduled to follow-up in heart failure clinic for pulmonary hypertension screening.  From a symptomatic standpoint she has been doing fairly well.  Reports no shortness of breath, lower extremity edema, lightheadedness or other symptoms consistent with underlying PAH.  She underwent echocardiogram and PFTs today.   Past Medical History:  Diagnosis Date   Anxiety    Arthritis    Rhuematoid arthritis   Blood transfusion without reported diagnosis 2007   Cancer [facility]) 2012   skin cancer; face   Complication of anesthesia    airway obstruction during endo procedure   Elevated LFTs    GERD (gastroesophageal reflux disease)    Hemorrhoids    History of colon polyps    Hypoglycemia    Rosacea     Current Outpatient Medications  Medication Sig Dispense Refill   Calcium Carbonate Antacid (TUMS CHEWY DELIGHTS) 1177 MG CHEW Chew 1-2 tablets by mouth as needed.     HUMIRA PEN 40 MG/0.4ML PNKT Inject 40 mg into the skin every 14 (fourteen) days.     Multiple Vitamin (MULTIVITAMIN) tablet Take 1 tablet by mouth daily.     sertraline (ZOLOFT) 50 MG tablet Take 50 mg by mouth daily.     No current facility-administered medications for this encounter.    Allergies  Allergen Reactions   Doxycycline Nausea And Vomiting   Hydroxychloroquine Nausea And  Vomiting   Other     ANY Antibiotics (quins and cillins)      Social History   Socioeconomic History   Marital status: Married    Spouse name: Not on file   Number of children: Not on file   Years of education: Not on file   Highest education level: Not on file  Occupational History   Not on file  Tobacco Use   Smoking status: Never   Smokeless tobacco: Never  Vaping Use   Vaping status: Never Used  Substance and Sexual Activity   Alcohol use: Not Currently    Alcohol/week: 21.0 standard drinks of alcohol    Types: 21 Glasses of wine per week    Comment: pt states she stopped drinking 07/21/2022   Drug use: No   Sexual activity: Not on file  Other Topics Concern   Not on file  Social History Narrative   Not on file   Social Determinants of Health   Financial Resource Strain: Not on file  Food Insecurity: Not on file  Transportation Needs: Not on file  Physical Activity: Not on file  Stress: Not on file  Social Connections: Not on file  Intimate Partner Violence: Not on file      Family History  Problem Relation Age of Onset   Stomach cancer Mother    Rectal cancer Sister    Colon cancer Neg Hx    Esophageal cancer Neg Hx     PHYSICAL EXAM: Vitals:   03/04/23 1131  BP:  104/70  Pulse: 84  SpO2: 98%   GENERAL: Well nourished, well developed, and in no apparent distress at rest.  HEENT: Negative for arcus senilis or xanthelasma. There is no scleral icterus.  The mucous membranes are pink and moist.   NECK: Supple, No masses. Normal carotid upstrokes without bruits. No masses or thyromegaly.    CHEST: There are no chest wall deformities. There is no chest wall tenderness. Respirations are unlabored.  Lungs- CTA B/L CARDIAC:  JVP: 5 cm H2O         Normal S1, S2  Normal rate with regular rhythm. No murmurs, rubs or gallops.  Pulses are 2+ and symmetrical in upper and lower extremities. No edema.  ABDOMEN: Soft, non-tender, non-distended. There are no masses  or hepatomegaly. There are normal bowel sounds.  EXTREMITIES: Warm and well perfused; thickening of digits consistent with Raynauds LYMPHATIC: No axillary or supraclavicular lymphadenopathy.  NEUROLOGIC: Patient is oriented x3 with no focal or lateralizing neurologic deficits.  PSYCH: Patients affect is appropriate, there is no evidence of anxiety or depression.  SKIN: Warm and dry; no lesions or wounds.   DATA REVIEW  ECG: 03/04/23: NSR  As per my personal interpretation  ECHO: 03/04/23: LVEF 60%, normal RV function; RVSP As per my personal interpretation  CATH: Pending   ASSESSMENT & PLAN:  Evaluation for pulmonary hypertension - Referred by [facility] for [facility] screening secondary to rheumatoid arthritis and some concern for CTD/scleroderma. - TTE today with normal RV/LV function; mildly elevated RVSP - PFTs with normal volumes; however, uncorrected DLCO reduced to 77%.  - From a functional standpoint she is doing very well; NYHA II.  - BMP/BNP today.  - Reviewed labs from rheumatology clinic: normal ESR/CCP/CRP/hep B & C; mildly elevated liver enzymes; RF 30 in 2018. Normal quant gold. Rise in RF to 102 in 2023 with ANA+.  - After lengthy discussion with her she wishes to proceed with RHC in the setting of abnormal DLCO. Will schedule and send work up to Dr. Alben Deeds.   2. Rheumatoid arthritis - Followed by Dr. Dierdre Forth - Now on Humira - As noted above; +RF factor; ANA+; positive centromere w/ skin thickening & raynauds; some concern for scleroderma.   3. Raynauds - Improved after starting amlodipine.  - Continue amlodipine 5mg .   I spent 40 minutes caring for this patient today including face to face time, ordering and reviewing labs, reviewing records from [facility], seeing the patient, documenting in the record, and arranging follow ups/scheduling RHC.   Ana Graves Advanced Heart Failure Mechanical Circulatory Support

## 2023-03-19 ENCOUNTER — Telehealth (HOSPITAL_COMMUNITY): Payer: Self-pay | Admitting: Cardiology

## 2023-03-19 NOTE — Telephone Encounter (Signed)
Pt called to cancel upcoming procedure Reports she felt procedure is not needed at this time and "is done with it all for the moment".   Procedure cancelled with Zack in cath lab Message to provider as Lorain Childes

## 2023-03-23 ENCOUNTER — Encounter (HOSPITAL_COMMUNITY): Admission: RE | Payer: Self-pay | Source: Home / Self Care

## 2023-03-23 ENCOUNTER — Ambulatory Visit (HOSPITAL_COMMUNITY): Admission: RE | Admit: 2023-03-23 | Payer: Medicare Other | Source: Home / Self Care | Admitting: Cardiology

## 2023-03-23 SURGERY — RIGHT HEART CATH
Anesthesia: LOCAL

## 2023-04-09 ENCOUNTER — Encounter: Payer: Self-pay | Admitting: Gastroenterology

## 2023-08-03 ENCOUNTER — Other Ambulatory Visit: Payer: Self-pay | Admitting: Orthopedic Surgery

## 2023-08-03 ENCOUNTER — Ambulatory Visit
Admission: RE | Admit: 2023-08-03 | Discharge: 2023-08-03 | Disposition: A | Discharge status: Home / Self Care | Source: Ambulatory Visit | Attending: Orthopedic Surgery | Admitting: Orthopedic Surgery

## 2023-08-03 ENCOUNTER — Ambulatory Visit
Admission: RE | Admit: 2023-08-03 | Discharge: 2023-08-03 | Disposition: A | Discharge status: Home / Self Care | Payer: Medicare Other | Source: Ambulatory Visit | Attending: Obstetrics & Gynecology | Admitting: Obstetrics & Gynecology

## 2023-08-03 DIAGNOSIS — S0191XA Laceration without foreign body of unspecified part of head, initial encounter: Secondary | ICD-10-CM

## 2023-08-03 DIAGNOSIS — Z780 Asymptomatic menopausal state: Secondary | ICD-10-CM

## 2023-08-03 DIAGNOSIS — E2839 Other primary ovarian failure: Secondary | ICD-10-CM

## 2023-08-10 ENCOUNTER — Telehealth: Payer: Self-pay | Admitting: Gastroenterology

## 2023-08-10 NOTE — Telephone Encounter (Signed)
 Please send Rx for Pantoprazole 40mg  BID, she has h/o erosive esophagitis. Follow up with APP soon. If she has persistent vomiting, will need to come to ER

## 2023-08-10 NOTE — Telephone Encounter (Signed)
 The pt is calling with complaints of vomiting blood this morning ( small amount of pink/red in the sink ) then a 2nd episode that was brown/red mixed with a cup of water she had recently drank.  She did have a fall a week ago and hit her head.  She had CT and states it was "normal" but a referral to neuro was made as well. She has some stomach discomfort- always feels hungry but is not able to eat very well due to some dental issues ( 2 crowns installed 2 months ago) It causes pain when she chews as well as temperature changes.  She has spoken with her dentist and does not feel the crown is the cause.  She does have daily vomiting in the morning but no nausea. She is not able to even keep water down. After that episode in the morning the vomiting subsides.  She has never had blood when this has happened before.  Dr Lavon Paganini please advise

## 2023-08-10 NOTE — Telephone Encounter (Signed)
 Inbound call from patient stating that this morning she vomited blood. Patient stated she thought Dr. Lavon Paganini should know and is requesting a call back to further discuss what she should do. Please advise.

## 2023-08-11 ENCOUNTER — Inpatient Hospital Stay (HOSPITAL_COMMUNITY)

## 2023-08-11 ENCOUNTER — Other Ambulatory Visit: Payer: Self-pay

## 2023-08-11 ENCOUNTER — Inpatient Hospital Stay (HOSPITAL_COMMUNITY)
Admission: EM | Admit: 2023-08-11 | Discharge: 2023-08-13 | DRG: 381 | Disposition: A | Discharge status: Home / Self Care | Attending: Internal Medicine | Admitting: Internal Medicine

## 2023-08-11 DIAGNOSIS — R1013 Epigastric pain: Secondary | ICD-10-CM | POA: Diagnosis not present

## 2023-08-11 DIAGNOSIS — F1010 Alcohol abuse, uncomplicated: Secondary | ICD-10-CM | POA: Diagnosis present

## 2023-08-11 DIAGNOSIS — K219 Gastro-esophageal reflux disease without esophagitis: Secondary | ICD-10-CM | POA: Diagnosis present

## 2023-08-11 DIAGNOSIS — I10 Essential (primary) hypertension: Secondary | ICD-10-CM | POA: Diagnosis present

## 2023-08-11 DIAGNOSIS — E871 Hypo-osmolality and hyponatremia: Secondary | ICD-10-CM | POA: Diagnosis present

## 2023-08-11 DIAGNOSIS — K920 Hematemesis: Secondary | ICD-10-CM

## 2023-08-11 DIAGNOSIS — D649 Anemia, unspecified: Secondary | ICD-10-CM | POA: Diagnosis present

## 2023-08-11 DIAGNOSIS — Z888 Allergy status to other drugs, medicaments and biological substances status: Secondary | ICD-10-CM

## 2023-08-11 DIAGNOSIS — M069 Rheumatoid arthritis, unspecified: Secondary | ICD-10-CM | POA: Diagnosis present

## 2023-08-11 DIAGNOSIS — R7401 Elevation of levels of liver transaminase levels: Secondary | ICD-10-CM | POA: Diagnosis not present

## 2023-08-11 DIAGNOSIS — K625 Hemorrhage of anus and rectum: Principal | ICD-10-CM

## 2023-08-11 DIAGNOSIS — K2971 Gastritis, unspecified, with bleeding: Secondary | ICD-10-CM | POA: Diagnosis present

## 2023-08-11 DIAGNOSIS — Z85828 Personal history of other malignant neoplasm of skin: Secondary | ICD-10-CM

## 2023-08-11 DIAGNOSIS — K222 Esophageal obstruction: Secondary | ICD-10-CM | POA: Diagnosis present

## 2023-08-11 DIAGNOSIS — L719 Rosacea, unspecified: Secondary | ICD-10-CM | POA: Diagnosis present

## 2023-08-11 DIAGNOSIS — Z881 Allergy status to other antibiotic agents status: Secondary | ICD-10-CM

## 2023-08-11 DIAGNOSIS — K449 Diaphragmatic hernia without obstruction or gangrene: Secondary | ICD-10-CM | POA: Diagnosis present

## 2023-08-11 DIAGNOSIS — K3189 Other diseases of stomach and duodenum: Secondary | ICD-10-CM | POA: Diagnosis present

## 2023-08-11 DIAGNOSIS — R109 Unspecified abdominal pain: Secondary | ICD-10-CM

## 2023-08-11 DIAGNOSIS — K709 Alcoholic liver disease, unspecified: Secondary | ICD-10-CM | POA: Diagnosis present

## 2023-08-11 DIAGNOSIS — K921 Melena: Secondary | ICD-10-CM

## 2023-08-11 DIAGNOSIS — K209 Esophagitis, unspecified without bleeding: Secondary | ICD-10-CM | POA: Diagnosis not present

## 2023-08-11 DIAGNOSIS — K922 Gastrointestinal hemorrhage, unspecified: Secondary | ICD-10-CM | POA: Diagnosis not present

## 2023-08-11 DIAGNOSIS — Z79899 Other long term (current) drug therapy: Secondary | ICD-10-CM | POA: Diagnosis not present

## 2023-08-11 DIAGNOSIS — K2970 Gastritis, unspecified, without bleeding: Secondary | ICD-10-CM | POA: Diagnosis not present

## 2023-08-11 DIAGNOSIS — Z800 Family history of malignant neoplasm of digestive organs: Secondary | ICD-10-CM

## 2023-08-11 DIAGNOSIS — K2211 Ulcer of esophagus with bleeding: Secondary | ICD-10-CM | POA: Diagnosis present

## 2023-08-11 LAB — COMPREHENSIVE METABOLIC PANEL
ALT: 54 U/L — ABNORMAL HIGH (ref 0–44)
AST: 117 U/L — ABNORMAL HIGH (ref 15–41)
Albumin: 4.2 g/dL (ref 3.5–5.0)
Alkaline Phosphatase: 45 U/L (ref 38–126)
Anion gap: 10 (ref 5–15)
BUN: 12 mg/dL (ref 8–23)
CO2: 26 mmol/L (ref 22–32)
Calcium: 9.5 mg/dL (ref 8.9–10.3)
Chloride: 96 mmol/L — ABNORMAL LOW (ref 98–111)
Creatinine, Ser: 0.47 mg/dL (ref 0.44–1.00)
GFR, Estimated: >60 mL/min (ref >60)
Glucose, Bld: 104 mg/dL — ABNORMAL HIGH (ref 70–99)
Potassium: 4.3 mmol/L (ref 3.5–5.1)
Sodium: 132 mmol/L — ABNORMAL LOW (ref 135–145)
Total Bilirubin: 0.9 mg/dL (ref 0.0–1.2)
Total Protein: 7.5 g/dL (ref 6.5–8.1)

## 2023-08-11 LAB — CBC
HCT: 33.8 % — ABNORMAL LOW (ref 36.0–46.0)
Hemoglobin: 11 g/dL — ABNORMAL LOW (ref 12.0–15.0)
MCH: 34.7 pg — ABNORMAL HIGH (ref 26.0–34.0)
MCHC: 32.5 g/dL (ref 30.0–36.0)
MCV: 106.6 fL — ABNORMAL HIGH (ref 80.0–100.0)
Platelets: 170 10*3/uL (ref 150–400)
RBC: 3.17 MIL/uL — ABNORMAL LOW (ref 3.87–5.11)
RDW: 11.8 % (ref 11.5–15.5)
WBC: 6.5 10*3/uL (ref 4.0–10.5)
nRBC: 0 % (ref 0.0–0.2)

## 2023-08-11 LAB — TROPONIN I (HIGH SENSITIVITY): Troponin I (High Sensitivity): 7 ng/L (ref <18)

## 2023-08-11 LAB — PREPARE RBC (CROSSMATCH)

## 2023-08-11 LAB — ABO/RH: ABO/RH(D): A POS

## 2023-08-11 LAB — LIPASE, BLOOD: Lipase: 58 U/L — ABNORMAL HIGH (ref 11–51)

## 2023-08-11 LAB — POC OCCULT BLOOD, ED: Fecal Occult Bld: POSITIVE — AB

## 2023-08-11 MED ORDER — SODIUM CHLORIDE 0.9 % IV SOLN
50.0000 ug/h | INTRAVENOUS | Status: DC
  Administered 2023-08-12 (×2): 50 ug/h via INTRAVENOUS
  Filled 2023-08-11 (×2): qty 1

## 2023-08-11 MED ORDER — SODIUM CHLORIDE 0.9% FLUSH
3.0000 mL | Freq: Two times a day (BID) | INTRAVENOUS | Status: DC
  Administered 2023-08-11 – 2023-08-13 (×4): 3 mL via INTRAVENOUS

## 2023-08-11 MED ORDER — ONDANSETRON HCL 4 MG/2ML IJ SOLN
4.0000 mg | Freq: Once | INTRAMUSCULAR | Status: AC
  Administered 2023-08-11: 4 mg via INTRAVENOUS
  Filled 2023-08-11: qty 2

## 2023-08-11 MED ORDER — OCTREOTIDE LOAD VIA INFUSION
50.0000 ug | Freq: Once | INTRAVENOUS | Status: AC
  Administered 2023-08-11: 50 ug via INTRAVENOUS
  Filled 2023-08-11: qty 25

## 2023-08-11 MED ORDER — PANTOPRAZOLE SODIUM 40 MG IV SOLR
40.0000 mg | Freq: Two times a day (BID) | INTRAVENOUS | Status: DC
  Administered 2023-08-12 – 2023-08-13 (×3): 40 mg via INTRAVENOUS
  Filled 2023-08-11 (×3): qty 10

## 2023-08-11 MED ORDER — PANTOPRAZOLE SODIUM 40 MG PO TBEC: 40.0000 mg | DELAYED_RELEASE_TABLET | Freq: Two times a day (BID) | ORAL | 3 refills | Status: DC

## 2023-08-11 MED ORDER — PANTOPRAZOLE SODIUM 40 MG IV SOLR
80.0000 mg | Freq: Once | INTRAVENOUS | Status: AC
  Administered 2023-08-11: 80 mg via INTRAVENOUS
  Filled 2023-08-11 (×2): qty 20

## 2023-08-11 MED ORDER — SODIUM CHLORIDE 0.9% IV SOLUTION: Freq: Once | INTRAVENOUS | Status: AC

## 2023-08-11 MED ORDER — OXYCODONE HCL 5 MG/5ML PO SOLN: 5.0000 mg | ORAL | Status: DC | PRN

## 2023-08-11 MED ORDER — SODIUM CHLORIDE 0.9 % IV SOLN: INTRAVENOUS | Status: DC

## 2023-08-11 MED ORDER — LACTATED RINGERS IV BOLUS
1000.0000 mL | Freq: Once | INTRAVENOUS | Status: AC
  Administered 2023-08-11: 1000 mL via INTRAVENOUS

## 2023-08-11 MED ORDER — ACETAMINOPHEN 160 MG/5ML PO SOLN
650.0000 mg | ORAL | Status: DC | PRN
  Filled 2023-08-11: qty 20.3

## 2023-08-11 MED ORDER — OXYCODONE HCL 5 MG PO TABS
5.0000 mg | ORAL_TABLET | ORAL | Status: DC | PRN
  Administered 2023-08-11 – 2023-08-12 (×6): 5 mg via ORAL
  Filled 2023-08-11 (×7): qty 1

## 2023-08-11 MED ORDER — POLYETHYLENE GLYCOL 3350 17 G PO PACK: 17.0000 g | PACK | Freq: Every day | ORAL | Status: DC | PRN

## 2023-08-11 NOTE — Assessment & Plan Note (Signed)
 Mild . No known baseline. Check inr ptt, chck acute hepatitis panel in AM. Given gi bleeding concern, will start [email or web address]

## 2023-08-11 NOTE — ED Provider Notes (Addendum)
 [place] EMERGENCY DEPARTMENT AT [facility] Provider Note   CSN: [id number] Arrival date & time: 08/11/23  1359     History  Chief Complaint  Patient presents with   Rectal Bleeding    Ana Graves is a 68 y.o. female.  Patient with c/o  a few episodes dark/black stool and episode blood in emesis in past day. No hx gi bleeding. Remote hx gastritis/esophagitis on egd, and a few colon polyps on colonoscopy. Denies abd pain or distension. No anticoagulant use. No nsaid or asa use. Feels generally weak. No faintness or syncope. No other abnormal bruising or bleeding.   The history is provided by the patient, medical records and the spouse.  Rectal Bleeding Associated symptoms: vomiting   Associated symptoms: no abdominal pain, no epistaxis and no fever        Home Medications Prior to Admission medications   Medication Sig Start Date End Date Taking? Authorizing Provider  HUMIRA PEN 40 MG/0.4ML PNKT Inject 40 mg into the skin every 14 (fourteen) days. 02/02/22   [provider]  Multiple Vitamin (MULTIVITAMIN) tablet Take 1 tablet by mouth 3 (three) times a week.    [provider]  pantoprazole (PROTONIX) 40 MG tablet Take 1 tablet (40 mg total) by mouth 2 (two) times daily. 08/11/23   Napoleon Form, MD  sertraline (ZOLOFT) 50 MG tablet Take 50 mg by mouth daily.    [provider]      Allergies    Doxycycline, Hydroxychloroquine, and Other    Review of Systems   Review of Systems  Constitutional:  Negative for fever.  HENT:  Negative for nosebleeds.   Respiratory:  Negative for shortness of breath.   Cardiovascular:  Negative for chest pain.  Gastrointestinal:  Positive for hematochezia, nausea and vomiting. Negative for abdominal pain and constipation.  Genitourinary:  Negative for dysuria, flank pain, hematuria and vaginal bleeding.  Musculoskeletal:  Negative for back pain.  Skin:  Negative for rash.   Neurological:  Negative for syncope and headaches.    Physical Exam Updated Vital Signs BP 120/76 (BP Location: Left Arm)   Pulse (!) 107   Temp 98.6 F (37 C) (Oral)   Resp 17   Ht 1.626 m (5\' 4" )   Wt 57.1 kg   SpO2 100%   BMI 21.61 kg/m  Physical Exam Vitals and nursing note reviewed.  Constitutional:      Appearance: Normal appearance. She is well-developed.  HENT:     Head: Atraumatic.     Nose: Nose normal.     Mouth/Throat:     Mouth: Mucous membranes are moist.  Eyes:     General: No scleral icterus.    Conjunctiva/sclera: Conjunctivae normal.  Neck:     Trachea: No tracheal deviation.  Cardiovascular:     Rate and Rhythm: Normal rate and regular rhythm.     Pulses: Normal pulses.     Heart sounds: Normal heart sounds. No murmur heard.    No friction rub. No gallop.  Pulmonary:     Effort: Pulmonary effort is normal. No respiratory distress.     Breath sounds: Normal breath sounds.  Abdominal:     General: Bowel sounds are normal. There is no distension.     Palpations: Abdomen is soft.     Tenderness: There is no abdominal tenderness. There is no guarding.  Genitourinary:    Comments: No cva tenderness. Rectal, pt kept covered, spouse/chaperone in room - v dark brown  almost black stool - sent for hemoccult.  Musculoskeletal:        General: No swelling.     Cervical back: Normal range of motion and neck supple. No rigidity. No muscular tenderness.  Skin:    General: Skin is warm and dry.     Findings: No rash.  Neurological:     Mental Status: She is alert.     Comments: Alert, speech normal.   Psychiatric:        Mood and Affect: Mood normal.     ED Results / Procedures / Treatments   Labs (all labs ordered are listed, but only abnormal results are displayed) Results for orders placed or performed during the hospital encounter of 08/11/23  Comprehensive metabolic panel   Collection Time: 08/11/23  3:00 PM  Result Value Ref Range   Sodium 132  (L) 135 - 145 mmol/L   Potassium 4.3 3.5 - 5.1 mmol/L   Chloride 96 (L) 98 - 111 mmol/L   CO2 26 22 - 32 mmol/L   Glucose, Bld 104 (H) 70 - 99 mg/dL   BUN 12 8 - 23 mg/dL   Creatinine, Ser 0.98 0.44 - 1.00 mg/dL   Calcium 9.5 8.9 - 11.9 mg/dL   Total Protein 7.5 6.5 - 8.1 g/dL   Albumin 4.2 3.5 - 5.0 g/dL   AST 147 (H) 15 - 41 U/L   ALT 54 (H) 0 - 44 U/L   Alkaline Phosphatase 45 38 - 126 U/L   Total Bilirubin 0.9 0.0 - 1.2 mg/dL   GFR, Estimated >82 >95 mL/min   Anion gap 10 5 - 15  CBC   Collection Time: 08/11/23  3:00 PM  Result Value Ref Range   WBC 6.5 4.0 - 10.5 K/uL   RBC 3.17 (L) 3.87 - 5.11 MIL/uL   Hemoglobin 11.0 (L) 12.0 - 15.0 g/dL   HCT 62.1 (L) 30.8 - 65.7 %   MCV 106.6 (H) 80.0 - 100.0 fL   MCH 34.7 (H) 26.0 - 34.0 pg   MCHC 32.5 30.0 - 36.0 g/dL   RDW 84.6 96.2 - 95.2 %   Platelets 170 150 - 400 K/uL   nRBC 0.0 0.0 - 0.2 %  POC occult blood, ED Provider will collect   Collection Time: 08/11/23  5:33 PM  Result Value Ref Range   Fecal Occult Bld POSITIVE (A) NEGATIVE   CT HEAD WO CONTRAST ( ) Result Date: 08/03/2023 CLINICAL DATA:  Fall 6 days ago with forehead injury. EXAM: CT HEAD WITHOUT CONTRAST TECHNIQUE: Contiguous axial images were obtained from the base of the skull through the vertex without intravenous contrast. RADIATION DOSE REDUCTION: This exam was performed according to the departmental dose-optimization program which includes automated exposure control, adjustment of the mA and/or kV according to patient size and/or use of iterative reconstruction technique. COMPARISON:  None Available. FINDINGS: Brain: No evidence of acute infarction, hemorrhage, hydrocephalus, extra-axial collection or mass lesion/mass effect. Generalized brain atrophy Vascular: No hyperdense vessel or unexpected calcification. Skull: Negative for fracture Sinuses/Orbits: No acute finding. IMPRESSION: No evidence of intracranial injury.  Negative for facial fracture.  Electronically Signed   By: Tiburcio Pea M.D.   On: 08/03/2023 12:46   DG BONE DENSITY (DXA) Result Date: 08/03/2023 EXAM: DUAL X-RAY ABSORPTIOMETRY (DXA) FOR BONE MINERAL DENSITY IMPRESSION: Referring Physician:  VAISHALI MODY Your patient completed a bone mineral density test using GE Lunar iDXA system (analysis version: 16). Technologist: BEC PATIENT: Name: Thresa, Dozier Patient ID: [id number] Birth Date:  March 13, 1956 Height: 63.5 in. Sex: Female Measured: 08/03/2023 Weight: 117.6 lbs. Indications: Caucasian, Estrogen Deficient, Height Loss (781.91), History of Fracture (Adult) (V15.51), Humira, Multiple broken bones, Postmenopausal, Rheumatoid Arthritis (714.0), Zoloft Fractures: Left Wrist, Right Ankle, Right Wrist Treatments: ASSESSMENT: The BMD measured at Femur Neck Left is 0.936 g/cm2 with a T-score of -0.7. This patient's diagnostic category is NORMAL according to World Health Organization [facility]) criteria. The lumbar spine was excluded due to being excluded from the prior exam. The quality of the exam is good. Site Region Measured Date Measured Age YA BMD Significant CHANGE T-score DualFemur Neck Left 08/03/2023 67.7 -0.7 0.936 g/cm2 * DualFemur Neck Left 04[birth year]13/2019 63.4 -0.1 1.018 g/cm2 * DualFemur Total Mean 08/03/2023 67.7 0.8 1.104 g/cm2 DualFemur Total Mean 04/13/2019 63.4 0.8 1.111 g/cm2 Left Forearm Radius 33% 08/03/2023 67.7 -0.6 0.826 g/cm2 Left Forearm Radius 33% 04/13/2019 63.4 -0.2 0.867 g/cm2 World Health Organization [facility]) criteria for post-menopausal, Caucasian Women: Normal       T-score at or above -1 SD Osteopenia   T-score between -1 and -2.5 SD Osteoporosis T-score at or below -2.5 SD RECOMMENDATION: 1. All patients should optimize calcium and vitamin D intake. 2. Consider FDA-approved medical therapies in postmenopausal women and men aged 4 years and older, based on the following: a. A hip or vertebral (clinical or morphometric) fracture. b. T-score = -2.5 at the femoral  neck or spine after appropriate evaluation to exclude secondary causes. c. Low bone mass (T-score between -1.0 and -2.5 at the femoral neck or spine) and a 10-year probability of a hip fracture = 3% or a 10-year probability of a major osteoporosis-related fracture = 20% based on the [place]-adapted WHO algorithm. d. Clinician judgment and/or patient preferences may indicate treatment for people with 10-year fracture probabilities above or below these levels. FOLLOW-UP: Patients with diagnosis of osteoporosis or at high risk for fracture should have regular bone mineral density tests.? Patients eligible for Medicare are allowed routine testing every 2 years.? The testing frequency can be increased to one year for patients who have rapidly progressing disease, are receiving or discontinuing medical therapy to restore bone mass, or have additional risk factors. I have reviewed this study and agree with the findings. [facility] Electronically Signed   By: Harmon Pier M.D.   On: 08/03/2023 09:58     EKG None  Radiology No results found.  Procedures Procedures    Medications Ordered in ED Medications  lactated ringers bolus 1,000 mL (has no administration in time range)  pantoprazole (PROTONIX) injection 80 mg (has no administration in time range)  ondansetron (ZOFRAN) injection 4 mg (has no administration in time range)  0.9 %  sodium chloride infusion (Manually program via Guardrails IV Fluids) (has no administration in time range)    ED Course/ Medical Decision Making/ A&P                                 Medical Decision Making Problems Addressed: Hematemesis with nausea: acute illness or injury with systemic symptoms that poses a threat to life or bodily functions Rectal bleeding: acute illness or injury with systemic symptoms that poses a threat to life or bodily functions Symptomatic anemia: acute illness or injury with systemic symptoms that poses a threat to life or bodily  functions  Amount and/or Complexity of Data Reviewed External Data Reviewed: labs and notes. Labs: ordered. Decision-making details documented in ED Course. Discussion of management  or test interpretation with external provider(s): Gi, medicine  Risk Prescription drug management. Decision regarding hospitalization.   Iv ns. Continuous pulse ox and cardiac monitoring. Labs ordered/sent.   Differential diagnosis includes gi bleeding, pud, etc. Dispo decision including potential need for admission considered - will get labs and reassess.   Reviewed nursing notes and prior charts for additional history. External reports reviewed. Additional history from: spouse.   LR bolus. Protonix iv. Zofran iv.   Cardiac monitor: sinus rhythm, rate 100  GI consulted.  Discussed pt with Dr Lavon Paganini (pts gi doctor) - she indicates can be on clear now, npo after MN, they will follow and possible endoscopy in AM.   Medicine consulted for admission.   Labs reviewed/interpreted by me - hgb 11, lower than prior. Chem unremarkable. Hemoccult pos.   As symptomatic, faint/weak, drop in hgb, hematemesis, will transfuse prbc.   CRITICAL CARE RE; acute gi bleeding, hematemesis, symptomatic anemia, emergent prbc transfusion Performed by: Suzi Roots Total critical care time: 40 minutes Critical care time was exclusive of separately billable procedures and treating other patients. Critical care was necessary to treat or prevent imminent or life-threatening deterioration. Critical care was time spent personally by me on the following activities: development of treatment plan with patient and/or surrogate as well as nursing, discussions with consultants, evaluation of patient's response to treatment, examination of patient, obtaining history from patient or surrogate, ordering and performing treatments and interventions, ordering and review of laboratory studies, ordering and review of radiographic studies, pulse  oximetry and re-evaluation of patient's condition.           Final Clinical Impression(s) / ED Diagnoses Final diagnoses:  Rectal bleeding  Symptomatic anemia  Hematemesis with nausea    Rx / DC Orders ED Discharge Orders     None          Cathren Laine, MD 08/11/23 1820

## 2023-08-11 NOTE — H&P (Signed)
 History and Physical    Patient: Ana Graves ION:[id number] DOB: August 17, 1955 DOA: 08/11/2023 DOS: the patient was seen and examined on 08/11/2023 PCP: [facility] [place]  Patient coming from: Home  Chief Complaint:  Chief Complaint  Patient presents with   Rectal Bleeding   HPI: Ana Graves is a 68 y.o. female with medical history significant of medical issues as listed below.  Patient had dental procedure done about 3 months ago with "capping "done of the right lower morals molars posteriorly.  Patient reports continued discomfort at the site since then.  And states that she often vomits at least once a day after drinking fluids.  However typically is able to tolerate diet after that.  Rather abruptly yesterday when she threw up and the morning, after drinking a glass of water, she realized there was blood in it.  Patient initially did not make much of it.  However patient reports that she had continued vomiting yesterday with any attempt to eat or drink.  Although there was no recurrence of hematemesis or blood in the vomiting.  Patient noted a new onset of epigastric versus bilateral upper abdominal area discomfort that she is not able to further characterize.  Without any identified aggravating relieving factor.  And then today, patient had at least 2 episodes of black liquidy bowel movement at home with another 1 reported here in the ER by the patient.  Patient denies any fever any dysuria or any trauma.  Patient presented to the ER with the concern for GI bleeding given her melena, patient is found to be vitally stable, GI has been engaged by ER provider.  Medical evaluation is sought.  No chest pain, no sob. No presncope. No nose bleeding or oral bleeding. Review of Systems: As mentioned in the history of present illness. All other systems reviewed and are negative. Past Medical History:  Diagnosis Date   Anxiety    Arthritis    Rhuematoid  arthritis   Blood transfusion without reported diagnosis 2007   Cancer [facility]) 2012   skin cancer; face   Complication of anesthesia    airway obstruction during endo procedure   Elevated LFTs    GERD (gastroesophageal reflux disease)    Hemorrhoids    History of colon polyps    Hypoglycemia    Rosacea    Past Surgical History:  Procedure Laterality Date   BIOPSY  04/21/2022   Procedure: BIOPSY;  Surgeon: Napoleon Form, MD;  Location: [facility];  Service: Gastroenterology;;   ESOPHAGOGASTRODUODENOSCOPY N/A 04/21/2022   Procedure: ESOPHAGOGASTRODUODENOSCOPY (EGD);  Surgeon: Napoleon Form, MD;  Location: [facility];  Service: Gastroenterology;  Laterality: N/A;   KNEE ARTHROSCOPY Left 1994   MOHS SURGERY  2012   OTHER SURGICAL HISTORY  1996   surgery for skin cancer   TUBAL LIGATION  1995   UTERINE FIBROID SURGERY  2004   Social History:  reports that she has never smoked. She has never used smokeless tobacco. She reports that she does not currently use alcohol after a past usage of about 21.0 standard drinks of alcohol per week. She reports that she does not use drugs.  Allergies  Allergen Reactions   Doxycycline Nausea And Vomiting   Hydroxychloroquine Nausea And Vomiting   Other Nausea And Vomiting    ANY Antibiotics (quins and cillins)    Family History  Problem Relation Age of Onset   Stomach cancer Mother    Rectal cancer Sister    Colon  cancer Neg Hx    Esophageal cancer Neg Hx     Prior to Admission medications   Medication Sig Start Date End Date Taking? Authorizing Provider  HUMIRA PEN 40 MG/0.4ML PNKT Inject 40 mg into the skin every 14 (fourteen) days. 02/02/22   [provider]  Multiple Vitamin (MULTIVITAMIN) tablet Take 1 tablet by mouth 3 (three) times a week.    [provider]  pantoprazole (PROTONIX) 40 MG tablet Take 1 tablet (40 mg total) by mouth 2 (two) times daily. 08/11/23   Napoleon Form, MD  sertraline  (ZOLOFT) 50 MG tablet Take 50 mg by mouth daily.    [provider]    Physical Exam: Vitals:   08/11/23 1427 08/11/23 1431 08/11/23 1807  BP: 120/76  (!) 141/90  Pulse: (!) 107  98  Resp: 17  18  Temp: 98.6 F (37 C)  98.8 F (37.1 C)  TempSrc: Oral  Oral  SpO2: 100%  100%  Weight:  57.1 kg   Height:  5\' 4"  (1.626 m)    General: Patient is alert awake oriented x 3 appears to be in no distress Respiratory exam: Bilateral intravesicular Cardiovascular exam S1-S2 normal Abdomen bowel sounds are hyperactive all quadrant soft nontender Extremities warm without edema Oral exam done with no finding of any active bleeding at this time. Rectal exam done by ER provider, reproted to me verbaly - melena. Data Reviewed:  Labs on Admission:  Results for orders placed or performed during the hospital encounter of 08/11/23 (from the past 24 hours)  Comprehensive metabolic panel     Status: Abnormal   Collection Time: 08/11/23  3:00 PM  Result Value Ref Range   Sodium 132 (L) 135 - 145 mmol/L   Potassium 4.3 3.5 - 5.1 mmol/L   Chloride 96 (L) 98 - 111 mmol/L   CO2 26 22 - 32 mmol/L   Glucose, Bld 104 (H) 70 - 99 mg/dL   BUN 12 8 - 23 mg/dL   Creatinine, Ser 4.09 0.44 - 1.00 mg/dL   Calcium 9.5 8.9 - 81.1 mg/dL   Total Protein 7.5 6.5 - 8.1 g/dL   Albumin 4.2 3.5 - 5.0 g/dL   AST 914 (H) 15 - 41 U/L   ALT 54 (H) 0 - 44 U/L   Alkaline Phosphatase 45 38 - 126 U/L   Total Bilirubin 0.9 0.0 - 1.2 mg/dL   GFR, Estimated >78 >29 mL/min   Anion gap 10 5 - 15  CBC     Status: Abnormal   Collection Time: 08/11/23  3:00 PM  Result Value Ref Range   WBC 6.5 4.0 - 10.5 K/uL   RBC 3.17 (L) 3.87 - 5.11 MIL/uL   Hemoglobin 11.0 (L) 12.0 - 15.0 g/dL   HCT 56.2 (L) 13.0 - 86.5 %   MCV 106.6 (H) 80.0 - 100.0 fL   MCH 34.7 (H) 26.0 - 34.0 pg   MCHC 32.5 30.0 - 36.0 g/dL   RDW 78.4 69.6 - 29.5 %   Platelets 170 150 - 400 K/uL   nRBC 0.0 0.0 - 0.2 %  POC occult blood, ED Provider will  collect     Status: Abnormal   Collection Time: 08/11/23  5:33 PM  Result Value Ref Range   Fecal Occult Bld POSITIVE (A) NEGATIVE   Basic Metabolic Panel: Recent Labs  Lab 08/11/23 1500  NA 132*  K 4.3  CL 96*  CO2 26  GLUCOSE 104*  BUN 12  CREATININE 0.47  CALCIUM 9.5   Liver Function Tests: Recent Labs  Lab 08/11/23 1500  AST 117*  ALT 54*  ALKPHOS 45  BILITOT 0.9  PROT 7.5  ALBUMIN 4.2   No results for input(s): "LIPASE", "AMYLASE" in the last 168 hours. No results for input(s): "AMMONIA" in the last 168 hours. CBC: Recent Labs  Lab 08/11/23 1500  WBC 6.5  HGB 11.0*  HCT 33.8*  MCV 106.6*  PLT 170   Cardiac Enzymes: No results for input(s): "CKTOTAL", "CKMB", "CKMBINDEX", "TROPONINIHS" in the last 168 hours.  BNP (last 3 results) No results for input(s): "PROBNP" in the last 8760 hours. CBG: No results for input(s): "GLUCAP" in the last 168 hours.  Radiological Exams on Admission:  No results found.  chest X-ray   No intake/output data recorded. No intake/output data recorded.      Assessment and Plan: * Acute upper GI bleeding Type and screen sent.  Patient has slight tachycardia on presentation of 107.  Currently resolved.  Continue with 100 cc/h of normal saline.  Pantoprazole 80 mg loading dose and 40 mg IV twice daily.  Clear liquid diet.  INR and PTT pending for the morning.  Given elevated liver function testing slight, I will order octreotide infusion.  GI engaged by ER provider, look forward to evaluation in the morning.  Abdominal pain Reports epigastric versus bilateral upper quadrant abdominal area discomfort for the last couple of days.  Has had about 1 episode of vomiting daily for the last 3 months or so.  And intolerance of diet completely over the last 48 hours.  With 1 episode of hematemesis, as mentioned above and finding of melena on rectal exam.  At this time check lipase as well as abdominal x-ray.  Look forward to GI  evaluation in the morning.  Liquid acetaminophen and oxycodone ordered as needed for pain control.  Transaminitis Mild . No known baseline. Check inr ptt, chck acute hepatitis panel in AM. Given gi bleeding concern, will start [email or web address]   Med rec pending pharmcy input.  Cxr pending.  Advance Care Planning:   Code Status: Full Code   Consults: GI  Family Communication: per pateint. Has been in touch with husband over phone.  Severity of Illness: The appropriate patient status for this patient is INPATIENT. Inpatient status is judged to be reasonable and necessary in order to provide the required intensity of service to ensure the patient's safety. The patient's presenting symptoms, physical exam findings, and initial radiographic and laboratory data in the context of their chronic comorbidities is felt to place them at high risk for further clinical deterioration. Furthermore, it is not anticipated that the patient will be medically stable for discharge from the hospital within 2 midnights of admission.   * I certify that at the point of admission it is my clinical judgment that the patient will require inpatient hospital care spanning beyond 2 midnights from the point of admission due to high intensity of service, high risk for further deterioration and high frequency of surveillance required.*  Author: Nolberto Hanlon, MD 08/11/2023 7:53 PM  For on call review [email or web address].

## 2023-08-11 NOTE — Assessment & Plan Note (Signed)
 Reports epigastric versus bilateral upper quadrant abdominal area discomfort for the last couple of days.  Has had about 1 episode of vomiting daily for the last 3 months or so.  And intolerance of diet completely over the last 48 hours.  With 1 episode of hematemesis, as mentioned above and finding of melena on rectal exam.  At this time check lipase as well as abdominal x-ray.  Look forward to GI evaluation in the morning.  Liquid acetaminophen and oxycodone ordered as needed for pain control.

## 2023-08-11 NOTE — ED Triage Notes (Signed)
 Pt states "yesterday I vomited blood and today I am having black stools" pt denies blood thinners, c/o feeling dizzy, and "sick like"

## 2023-08-11 NOTE — Telephone Encounter (Signed)
 Prescription has been sent to the pharmacy.   The pt has been advised and set up to see Jessica on 08/12/23 at 2 pm.

## 2023-08-11 NOTE — Assessment & Plan Note (Signed)
 Type and screen sent.  Patient has slight tachycardia on presentation of 107.  Currently resolved.  Continue with 100 cc/h of normal saline.  Pantoprazole 80 mg loading dose and 40 mg IV twice daily.  Clear liquid diet.  INR and PTT pending for the morning.  Given elevated liver function testing slight, I will order octreotide infusion.  GI engaged by ER provider, look forward to evaluation in the morning.

## 2023-08-11 NOTE — ED Notes (Signed)
 ED TO INPATIENT HANDOFF REPORT  ED Nurse Name and Phone #:  Leatrice Jewels, RN [phone]  S Name/Age/Gender Ana Graves 68 y.o. female Room/Bed: WA19/WA19  Code Status   Code Status: Full Code  Home/SNF/Other Home Patient oriented to: self, place, time, and situation Is this baseline? Yes   Triage Complete: Triage complete  Chief Complaint Hematemesis [K92.0]  Triage Note Pt states "yesterday I vomited blood and today I am having black stools" pt denies blood thinners, c/o feeling dizzy, and "sick like"   Allergies Allergies  Allergen Reactions   Doxycycline Nausea And Vomiting   Hydroxychloroquine Nausea And Vomiting   Other Nausea And Vomiting    ANY Antibiotics (quins and cillins)    Level of Care/Admitting Diagnosis ED Disposition     ED Disposition  Admit   Condition  --   Comment  Hospital Area: [facility] [[id number]]  Level of Care: Telemetry [5]  Admit to tele based on following criteria: Eval of Syncope  May admit patient to [facility] or [facility] if equivalent level of care is available:: No  Covid Evaluation: Asymptomatic - no recent exposure (last 10 days) testing not required  Diagnosis: Hematemesis [578.0.ICD-9-CM]  Admitting Physician: Nolberto Hanlon [[id number]]  Attending Physician: Nolberto Hanlon [[id number]]  Certification:: I certify this patient will need inpatient services for at least 2 midnights  Expected Medical Readiness: 08/13/2023          B Medical/Surgery History Past Medical History:  Diagnosis Date   Anxiety    Arthritis    Rhuematoid arthritis   Blood transfusion without reported diagnosis 2007   Cancer [facility]) 2012   skin cancer; face   Complication of anesthesia    airway obstruction during endo procedure   Elevated LFTs    GERD (gastroesophageal reflux disease)    Hemorrhoids    History of colon polyps    Hypoglycemia    Rosacea    Past Surgical History:  Procedure Laterality Date   BIOPSY   04/21/2022   Procedure: BIOPSY;  Surgeon: Napoleon Form, MD;  Location: [facility];  Service: Gastroenterology;;   ESOPHAGOGASTRODUODENOSCOPY N/A 04/21/2022   Procedure: ESOPHAGOGASTRODUODENOSCOPY (EGD);  Surgeon: Napoleon Form, MD;  Location: [facility];  Service: Gastroenterology;  Laterality: N/A;   KNEE ARTHROSCOPY Left 1994   MOHS SURGERY  2012   OTHER SURGICAL HISTORY  1996   surgery for skin cancer   TUBAL LIGATION  1995   UTERINE FIBROID SURGERY  2004     A IV Location/Drains/Wounds Patient Lines/Drains/Airways Status     Active Line/Drains/Airways     Name Placement date Placement time Site Days   Peripheral IV 08/11/23 20 G 1" Right Antecubital 08/11/23  1453  Antecubital  less than 1   Peripheral IV 08/11/23 20 G Left Antecubital 08/11/23  2015  Antecubital  less than 1            Intake/Output Last 24 hours No intake or output data in the 24 hours ending 08/11/23 2023  Labs/Imaging Results for orders placed or performed during the hospital encounter of 08/11/23 (from the past 48 hours)  Comprehensive metabolic panel     Status: Abnormal   Collection Time: 08/11/23  3:00 PM  Result Value Ref Range   Sodium 132 (L) 135 - 145 mmol/L   Potassium 4.3 3.5 - 5.1 mmol/L   Chloride 96 (L) 98 - 111 mmol/L   CO2 26 22 - 32 mmol/L   Glucose, Bld 104 (H)  70 - 99 mg/dL    Comment: Glucose reference range applies only to samples taken after fasting for at least 8 hours.   BUN 12 8 - 23 mg/dL   Creatinine, Ser 2.95 0.44 - 1.00 mg/dL   Calcium 9.5 8.9 - 18.8 mg/dL   Total Protein 7.5 6.5 - 8.1 g/dL   Albumin 4.2 3.5 - 5.0 g/dL   AST 416 (H) 15 - 41 U/L   ALT 54 (H) 0 - 44 U/L   Alkaline Phosphatase 45 38 - 126 U/L   Total Bilirubin 0.9 0.0 - 1.2 mg/dL   GFR, Estimated >60 >63 mL/min    Comment: (NOTE) Calculated using the CKD-EPI Creatinine Equation (2021)    Anion gap 10 5 - 15    Comment: Performed at [facility], [address], [place], [place] [address]  CBC     Status: Abnormal   Collection Time: 08/11/23  3:00 PM  Result Value Ref Range   WBC 6.5 4.0 - 10.5 K/uL   RBC 3.17 (L) 3.87 - 5.11 MIL/uL   Hemoglobin 11.0 (L) 12.0 - 15.0 g/dL   HCT 09.3 (L) 23.5 - 57.3 %   MCV 106.6 (H) 80.0 - 100.0 fL   MCH 34.7 (H) 26.0 - 34.0 pg   MCHC 32.5 30.0 - 36.0 g/dL   RDW 22.0 25.4 - 27.0 %   Platelets 170 150 - 400 K/uL   nRBC 0.0 0.0 - 0.2 %    Comment: Performed at [facility], [address], [place], [place] [address]  POC occult blood, ED Provider will collect     Status: Abnormal   Collection Time: 08/11/23  5:33 PM  Result Value Ref Range   Fecal Occult Bld POSITIVE (A) NEGATIVE   No results found.  Pending Labs Unresulted Labs (From admission, onward)     Start     Ordered   08/12/23 0500  APTT  Tomorrow morning,   R        08/11/23 1952   08/12/23 0500  Protime-INR  Tomorrow morning,   R        08/11/23 1952   08/12/23 0500  Basic metabolic panel  Tomorrow morning,   R        08/11/23 1952   08/12/23 0500  CBC  Tomorrow morning,   R        08/11/23 1952   08/12/23 0500  Hepatitis panel, acute  Tomorrow morning,   R        08/11/23 1957   08/11/23 2001  Lipase, blood  Add-on,   AD        08/11/23 2000   08/11/23 1951  HIV Antibody (routine testing w rflx)  (HIV Antibody (Routine testing w reflex) panel)  Once,   R        08/11/23 1952   08/11/23 1750  Prepare RBC (crossmatch)  (Blood Administration Adult)  Once,   R       Question Answer Comment  # of Units 1 unit   Transfusion Indications Other   Comments acute gi bleeding, hematemesis with drop in hgb and symptomatic   Number of Units to Keep Ahead NO units ahead   If emergent release call blood bank [facility] [phone]      08/11/23 1750   08/11/23 1733  Type and screen  ONCE - STAT,   STAT        08/11/23 1732  Vitals/Pain Today's Vitals   08/11/23 1431 08/11/23 1432 08/11/23 1807  08/11/23 2010  BP:   (!) 141/90 (!) 148/76  Pulse:   98 79  Resp:   18   Temp:   98.8 F (37.1 C)   TempSrc:   Oral   SpO2:   100% 100%  Weight: 57.1 kg     Height: 5\' 4"  (1.626 m)     PainSc:  3       Isolation Precautions No active isolations  Medications Medications  0.9 %  sodium chloride infusion (Manually program via Guardrails IV Fluids) (has no administration in time range)  0.9 %  sodium chloride infusion (has no administration in time range)  polyethylene glycol (MIRALAX / GLYCOLAX) packet 17 g (has no administration in time range)  sodium chloride flush (NS) 0.9 % injection 3 mL (has no administration in time range)  acetaminophen (TYLENOL) 160 MG/5ML solution 650 mg (has no administration in time range)  oxyCODONE (ROXICODONE) 5 MG/5ML solution 5 mg (has no administration in time range)  pantoprazole (PROTONIX) injection 40 mg (has no administration in time range)  octreotide (SANDOSTATIN) 2 mcg/mL load via infusion 50 mcg (has no administration in time range)    And  octreotide (SANDOSTATIN) 500 mcg in sodium chloride 0.9 % 250 mL (2 mcg/mL) infusion (has no administration in time range)  lactated ringers bolus 1,000 mL (0 mLs Intravenous Stopped 08/11/23 2019)  pantoprazole (PROTONIX) injection 80 mg (80 mg Intravenous Given 08/11/23 1901)  ondansetron (ZOFRAN) injection 4 mg (4 mg Intravenous Given 08/11/23 1904)    Mobility walks     Focused Assessments     R Recommendations: See Admitting Provider Note  Report given to:   Additional Notes:

## 2023-08-12 ENCOUNTER — Encounter (HOSPITAL_COMMUNITY): Payer: Self-pay | Admitting: Internal Medicine

## 2023-08-12 ENCOUNTER — Telehealth: Payer: Self-pay

## 2023-08-12 ENCOUNTER — Inpatient Hospital Stay (HOSPITAL_COMMUNITY): Admitting: Certified Registered"

## 2023-08-12 ENCOUNTER — Ambulatory Visit: Admitting: Gastroenterology

## 2023-08-12 ENCOUNTER — Encounter (HOSPITAL_COMMUNITY)
Admission: EM | Disposition: A | Discharge status: Home / Self Care | Payer: Self-pay | Source: Home / Self Care | Attending: Internal Medicine

## 2023-08-12 DIAGNOSIS — K920 Hematemesis: Secondary | ICD-10-CM | POA: Diagnosis not present

## 2023-08-12 DIAGNOSIS — K449 Diaphragmatic hernia without obstruction or gangrene: Secondary | ICD-10-CM

## 2023-08-12 DIAGNOSIS — K222 Esophageal obstruction: Secondary | ICD-10-CM | POA: Diagnosis not present

## 2023-08-12 DIAGNOSIS — K209 Esophagitis, unspecified without bleeding: Secondary | ICD-10-CM | POA: Diagnosis not present

## 2023-08-12 DIAGNOSIS — R1013 Epigastric pain: Secondary | ICD-10-CM | POA: Diagnosis not present

## 2023-08-12 DIAGNOSIS — K3189 Other diseases of stomach and duodenum: Secondary | ICD-10-CM

## 2023-08-12 DIAGNOSIS — K921 Melena: Secondary | ICD-10-CM | POA: Diagnosis not present

## 2023-08-12 DIAGNOSIS — F1010 Alcohol abuse, uncomplicated: Secondary | ICD-10-CM

## 2023-08-12 DIAGNOSIS — K2970 Gastritis, unspecified, without bleeding: Secondary | ICD-10-CM

## 2023-08-12 DIAGNOSIS — R7401 Elevation of levels of liver transaminase levels: Secondary | ICD-10-CM

## 2023-08-12 DIAGNOSIS — K922 Gastrointestinal hemorrhage, unspecified: Secondary | ICD-10-CM | POA: Diagnosis not present

## 2023-08-12 HISTORY — PX: ESOPHAGOGASTRODUODENOSCOPY: SHX5428

## 2023-08-12 LAB — HEMOGLOBIN AND HEMATOCRIT, BLOOD
HCT: 33.5 % — ABNORMAL LOW (ref 36.0–46.0)
HCT: 34.3 % — ABNORMAL LOW (ref 36.0–46.0)
HCT: 35.6 % — ABNORMAL LOW (ref 36.0–46.0)
Hemoglobin: 11 g/dL — ABNORMAL LOW (ref 12.0–15.0)
Hemoglobin: 11.1 g/dL — ABNORMAL LOW (ref 12.0–15.0)
Hemoglobin: 11.6 g/dL — ABNORMAL LOW (ref 12.0–15.0)

## 2023-08-12 LAB — BASIC METABOLIC PANEL
Anion gap: 11 (ref 5–15)
BUN: 10 mg/dL (ref 8–23)
CO2: 22 mmol/L (ref 22–32)
Calcium: 9.1 mg/dL (ref 8.9–10.3)
Chloride: 99 mmol/L (ref 98–111)
Creatinine, Ser: 0.6 mg/dL (ref 0.44–1.00)
GFR, Estimated: >60 mL/min (ref >60)
Glucose, Bld: 148 mg/dL — ABNORMAL HIGH (ref 70–99)
Potassium: 3.7 mmol/L (ref 3.5–5.1)
Sodium: 132 mmol/L — ABNORMAL LOW (ref 135–145)

## 2023-08-12 LAB — HEPATITIS PANEL, ACUTE
HCV Ab: NONREACTIVE
Hep A IgM: NONREACTIVE
Hep B C IgM: NONREACTIVE
Hepatitis B Surface Ag: NONREACTIVE

## 2023-08-12 LAB — CBC
HCT: 38.9 % (ref 36.0–46.0)
Hemoglobin: 12.7 g/dL (ref 12.0–15.0)
MCH: 34.6 pg — ABNORMAL HIGH (ref 26.0–34.0)
MCHC: 32.6 g/dL (ref 30.0–36.0)
MCV: 106 fL — ABNORMAL HIGH (ref 80.0–100.0)
Platelets: 134 10*3/uL — ABNORMAL LOW (ref 150–400)
RBC: 3.67 MIL/uL — ABNORMAL LOW (ref 3.87–5.11)
RDW: 12.8 % (ref 11.5–15.5)
WBC: 4.5 10*3/uL (ref 4.0–10.5)
nRBC: 0 % (ref 0.0–0.2)

## 2023-08-12 LAB — PROTIME-INR
INR: 1.2 (ref 0.8–1.2)
Prothrombin Time: 15.3 s — ABNORMAL HIGH (ref 11.4–15.2)

## 2023-08-12 LAB — TYPE AND SCREEN
ABO/RH(D): A POS
Antibody Screen: NEGATIVE
Unit division: 0

## 2023-08-12 LAB — BPAM RBC
Blood Product Expiration Date: 202504192359
ISSUE DATE / TIME: 202503192355
Unit Type and Rh: 6200

## 2023-08-12 LAB — APTT: aPTT: 30 s (ref 24–36)

## 2023-08-12 LAB — HIV ANTIBODY (ROUTINE TESTING W REFLEX): HIV Screen 4th Generation wRfx: NONREACTIVE

## 2023-08-12 SURGERY — EGD (ESOPHAGOGASTRODUODENOSCOPY)
Anesthesia: Monitor Anesthesia Care

## 2023-08-12 MED ORDER — MELATONIN 3 MG PO TABS
3.0000 mg | ORAL_TABLET | Freq: Every day | ORAL | Status: DC
  Administered 2023-08-12 (×2): 3 mg via ORAL
  Filled 2023-08-12 (×2): qty 1

## 2023-08-12 MED ORDER — DEXMEDETOMIDINE HCL IN NACL 80 MCG/20ML IV SOLN
INTRAVENOUS | Status: DC | PRN
  Administered 2023-08-12: 2 ug via INTRAVENOUS
  Administered 2023-08-12: 4 ug via INTRAVENOUS

## 2023-08-12 MED ORDER — LORAZEPAM 2 MG/ML IJ SOLN: 1.0000 mg | INTRAMUSCULAR | Status: DC | PRN

## 2023-08-12 MED ORDER — ADULT MULTIVITAMIN W/MINERALS CH
1.0000 | ORAL_TABLET | Freq: Every day | ORAL | Status: DC
Start: 2023-08-12 — End: 2023-08-13
  Administered 2023-08-12 – 2023-08-13 (×2): 1 via ORAL
  Filled 2023-08-12 (×2): qty 1

## 2023-08-12 MED ORDER — FOLIC ACID 1 MG PO TABS
1.0000 mg | ORAL_TABLET | Freq: Every day | ORAL | Status: DC
  Administered 2023-08-12 – 2023-08-13 (×2): 1 mg via ORAL
  Filled 2023-08-12 (×2): qty 1

## 2023-08-12 MED ORDER — LORAZEPAM 1 MG PO TABS: 1.0000 mg | ORAL_TABLET | ORAL | Status: DC | PRN

## 2023-08-12 MED ORDER — THIAMINE MONONITRATE 100 MG PO TABS
100.0000 mg | ORAL_TABLET | Freq: Every day | ORAL | Status: DC
  Administered 2023-08-12 – 2023-08-13 (×2): 100 mg via ORAL
  Filled 2023-08-12 (×2): qty 1

## 2023-08-12 MED ORDER — SODIUM CHLORIDE 0.9 % IV SOLN: INTRAVENOUS | Status: AC

## 2023-08-12 MED ORDER — PROPOFOL 500 MG/50ML IV EMUL
INTRAVENOUS | Status: DC | PRN
  Administered 2023-08-12: 175 ug/kg/min via INTRAVENOUS

## 2023-08-12 MED ORDER — PROPOFOL 10 MG/ML IV BOLUS
INTRAVENOUS | Status: DC | PRN
  Administered 2023-08-12: 30 mg via INTRAVENOUS
  Administered 2023-08-12: 20 mg via INTRAVENOUS
  Administered 2023-08-12: 30 mg via INTRAVENOUS
  Administered 2023-08-12: 50 mg via INTRAVENOUS

## 2023-08-12 MED ORDER — SUCRALFATE 1 GM/10ML PO SUSP
1.0000 g | Freq: Three times a day (TID) | ORAL | Status: DC
  Administered 2023-08-12 – 2023-08-13 (×3): 1 g via ORAL
  Filled 2023-08-12 (×3): qty 10

## 2023-08-12 MED ORDER — ONDANSETRON HCL 4 MG/2ML IJ SOLN
4.0000 mg | Freq: Four times a day (QID) | INTRAMUSCULAR | Status: DC | PRN
  Administered 2023-08-12 (×2): 4 mg via INTRAVENOUS
  Filled 2023-08-12 (×3): qty 2

## 2023-08-12 MED ORDER — LIDOCAINE 2% (20 MG/ML) 5 ML SYRINGE
INTRAMUSCULAR | Status: DC | PRN
  Administered 2023-08-12: 100 mg via INTRAVENOUS

## 2023-08-12 MED ORDER — PROPOFOL 10 MG/ML IV BOLUS
INTRAVENOUS | Status: AC
  Filled 2023-08-12: qty 20

## 2023-08-12 MED ORDER — THIAMINE HCL 100 MG/ML IJ SOLN: 100.0000 mg | Freq: Every day | INTRAMUSCULAR | Status: DC

## 2023-08-12 NOTE — Anesthesia Postprocedure Evaluation (Signed)
 Anesthesia Post Note  Patient: Ana Graves  Procedure(s) Performed: EGD (ESOPHAGOGASTRODUODENOSCOPY)     Patient location during evaluation: PACU Anesthesia Type: MAC Level of consciousness: awake and alert Pain management: pain level controlled Vital Signs Assessment: post-procedure vital signs reviewed and stable Respiratory status: spontaneous breathing, nonlabored ventilation and respiratory function stable Cardiovascular status: blood pressure returned to baseline and stable Postop Assessment: no apparent nausea or vomiting Anesthetic complications: no   No notable events documented.  Last Vitals:  Vitals:   08/12/23 1320 08/12/23 1342  BP: 130/70 (!) 149/82  Pulse: (!) 58 (!) 59  Resp: 17 17  Temp:  (!) 36.4 C  SpO2: 92%     Last Pain:  Vitals:   08/12/23 1345  TempSrc:   PainSc: 0-No pain                 Lowella Curb

## 2023-08-12 NOTE — Anesthesia Procedure Notes (Signed)
 Procedure Name: MAC Date/Time: 08/12/2023 12:30 PM  Performed by: Sindy Guadeloupe, CRNAPre-anesthesia Checklist: Patient identified, Emergency Drugs available, Suction available, Patient being monitored and Timeout performed Oxygen Delivery Method: Simple face mask Placement Confirmation: positive ETCO2

## 2023-08-12 NOTE — Consult Note (Signed)
 Consultation  Referring Provider: Dr. Mardelle Matte    Primary Care Physician:  [facility]ologist: Dr. Lavon Paganini        Reason for Consultation: Upper GI bleed             HPI:   Ana Graves is a 68 y.o. female with a past medical history significant for anxiety, skin cancer, reflux and multiple others, who presented to the ED on 08/11/2023 with melena and hematemesis.    At time of presentation patient described a dental procedure done about 3 months ago with "capping" done of the right lower molars, since then continued discomfort at the site and that often she vomits at least once a day after drinking fluids.  However typically is able to tolerate a diet after that.  On 08/10/2023 she threw up in the morning rather abruptly after drinking a glass of water and realize there was blood in it, then continued with vomiting with any attempt to eat or drink, no recurrence of hematemesis.  Also new onset of epigastric versus bilateral upper abdominal area discomfort that she was not able to characterize.  Then reported on 08/11/2023 that she had 2 episodes of black liquidy bowel movement at home with another 1 in the ER.    Today, patient is seen with her husband by her bedside who does assist with history.  She explains that about 3 months ago she had a dental procedure.  Since then she has had some vomiting almost daily when she wakes up from sleeping.  On Tuesday morning, 08/10/2023 she woke up and vomited twice quite a bit of black coffee-ground material.  It stopped after that and she went on with her day, then on Wednesday around 8:30 AM had a very black looking stool.  She had more around 12:00 and again a very small stool at 4 PM yesterday which brought her to the ER.  She has not vomited since being on antiemetics and has not had a bowel movement since yesterday evening.  Her last intake of food was yesterday afternoon.  She has not drank  anything this morning.  Does describe a fair amount of epigastric pain that radiated to both sides of her abdomen when she first started with symptoms two days ago, but she is on pain medicines now and tells me they are working well for her.  Does discuss that she chronically has had some issues with nausea and vomiting here and there but thought these were related to some medicine that she was taking for her rosacea (Doxycycline).  When she stopped the medicine the symptoms seemed to stop until most recently when she had her dental procedure.  Husband does note a decrease in appetite over the past 3 months and feels like she has lost some weight.      Does report daily alcohol use but will not tell me how much and tells me "I love to drink and I have never told anyone how much".  Does tell me she is no longer taking the Carafate or PPI as started in February of last year at time of her EGD.  Never repeated EGD as she was not having any further swallowing issues.  Denies any over-the-counter pain meds including NSAIDs due to "my liver".    Denies fever, chills or symptoms that awaken her from sleep.  ER course: Sodium 132, glucose 104, AST 117 and ALT 54, hemoglobin 11, MCV elevated at  106.6, platelets normal, fecal occult blood positive, INR normal  GI history: 10/08/2022 colonoscopy with one 8 mm polyp in the transverse colon, nonbleeding external and internal hemorrhoids; repeat recommended in 5 years 06/26/2022 EGD with 1 benign-appearing esophageal stenosis dilated with an endoscope during insertion, LA grade B reflux esophagitis, alcoholic gastritis and otherwise normal examined duodenum-recommended repeat EGD in 2 to 3 months with dilation, also started on Carafate for 4 weeks and Omeprazole 40 p.o. twice daily 06/19/2022 ultrasound elastography due to elevated liver enzymes with a median K PA 15.1 which is highly suggestive of CAC LD  Past Medical History:  Diagnosis Date   Anxiety    Arthritis     Rhuematoid arthritis   Blood transfusion without reported diagnosis 2007   Cancer [facility]) 2012   skin cancer; face   Complication of anesthesia    airway obstruction during endo procedure   Elevated LFTs    GERD (gastroesophageal reflux disease)    Hemorrhoids    History of colon polyps    Hypoglycemia    Rosacea     Past Surgical History:  Procedure Laterality Date   BIOPSY  04/21/2022   Procedure: BIOPSY;  Surgeon: Napoleon Form, MD;  Location: [facility];  Service: Gastroenterology;;   ESOPHAGOGASTRODUODENOSCOPY N/A 04/21/2022   Procedure: ESOPHAGOGASTRODUODENOSCOPY (EGD);  Surgeon: Napoleon Form, MD;  Location: [facility];  Service: Gastroenterology;  Laterality: N/A;   KNEE ARTHROSCOPY Left 1994   MOHS SURGERY  2012   OTHER SURGICAL HISTORY  1996   surgery for skin cancer   TUBAL LIGATION  1995   UTERINE FIBROID SURGERY  2004    Family History  Problem Relation Age of Onset   Stomach cancer Mother    Rectal cancer Sister    Colon cancer Neg Hx    Esophageal cancer Neg Hx     Social History   Tobacco Use   Smoking status: Never   Smokeless tobacco: Never  Vaping Use   Vaping status: Never Used  Substance Use Topics   Alcohol use: Not Currently    Alcohol/week: 21.0 standard drinks of alcohol    Types: 21 Glasses of wine per week    Comment: pt states she stopped drinking 07/21/2022   Drug use: No    Prior to Admission medications   Medication Sig Start Date End Date Taking? Authorizing Provider  sulfamethoxazole-trimethoprim (BACTRIM DS) 800-160 MG tablet TAKE 1 TABLET BY MOUTH TWICE A DAY FOR 14 DAYS 07/29/23  Yes [provider]  HUMIRA PEN 40 MG/0.4ML PNKT Inject 40 mg into the skin every 14 (fourteen) days. 02/02/22   [provider]  Multiple Vitamin (MULTIVITAMIN) tablet Take 1 tablet by mouth 3 (three) times a week.    [provider]  pantoprazole (PROTONIX) 40 MG tablet Take 1 tablet (40 mg total) by mouth 2  (two) times daily. 08/11/23   Napoleon Form, MD  sertraline (ZOLOFT) 50 MG tablet Take 50 mg by mouth daily.    [provider]    Current Facility-Administered Medications  Medication Dose Route Frequency Provider Last Rate Last Admin   0.9 %  sodium chloride infusion   Intravenous Continuous Nolberto Hanlon, MD 100 mL/hr at 08/12/23 0858 New Bag at 08/12/23 0858   acetaminophen (TYLENOL) 160 MG/5ML solution 650 mg  650 mg Oral Q4H PRN Nolberto Hanlon, MD       melatonin tablet 3 mg  3 mg Oral Sherene Sires, MD   3 mg at 08/12/23  0117   octreotide (SANDOSTATIN) 500 mcg in sodium chloride 0.9 % 250 mL (2 mcg/mL) infusion  50 mcg/hr Intravenous Continuous Nolberto Hanlon, MD 25 mL/hr at 08/12/23 0859 50 mcg/hr at 08/12/23 0859   ondansetron (ZOFRAN) injection 4 mg  4 mg Intravenous Q6H PRN Nolberto Hanlon, MD   4 mg at 08/12/23 0117   oxyCODONE (Oxy IR/ROXICODONE) immediate release tablet 5 mg  5 mg Oral Q4H PRN Nolberto Hanlon, MD   5 mg at 08/12/23 0535   pantoprazole (PROTONIX) injection 40 mg  40 mg Intravenous Conchita Paris, MD   40 mg at 08/12/23 1610   polyethylene glycol (MIRALAX / GLYCOLAX) packet 17 g  17 g Oral Daily PRN Nolberto Hanlon, MD       sodium chloride flush (NS) 0.9 % injection 3 mL  3 mL Intravenous Q12H Nolberto Hanlon, MD   3 mL at 08/12/23 9604    Allergies as of 08/11/2023 - Review Complete 08/11/2023  Allergen Reaction Noted   Doxycycline Nausea And Vomiting 06/05/2022   Hydroxychloroquine Nausea And Vomiting 06/05/2022   Other Nausea And Vomiting 09/02/2022     Review of Systems:    Constitutional: No weight loss, fever or chills Skin: No rash  Cardiovascular: No chest pain Respiratory: No SOB  Gastrointestinal: See HPI and otherwise negative Genitourinary: No dysuria  Neurological: +dizziness Musculoskeletal: No new muscle or joint pain Hematologic: No bruising Psychiatric: No history of depression or anxiety    Physical Exam:  Vital signs in last 24  hours: Temp:  [98 F (36.7 C)-98.8 F (37.1 C)] 98 F (36.7 C) (03/20 0550) Pulse Rate:  [62-107] 62 (03/20 0550) Resp:  [16-18] 18 (03/20 0550) BP: (120-163)/(73-91) 152/80 (03/20 0550) SpO2:  [81 %-100 %] 98 % (03/20 0550) Weight:  [57.1 kg] 57.1 kg (03/19 1431) Last BM Date : 08/11/23 General:   Pleasant elderly female appears to be in NAD, Well developed, Well nourished, alert and cooperative Head:  Normocephalic and atraumatic. Eyes:   PEERL, EOMI. No icterus. Conjunctiva pink. Ears:  Normal auditory acuity. Neck:  Supple Throat: Oral cavity and pharynx without inflammation, swelling or lesion.  Lungs: Respirations even and unlabored. Lungs clear to auscultation bilaterally.   No wheezes, crackles, or rhonchi.  Heart: Normal S1, S2. No MRG. Regular rate and rhythm. No peripheral edema, cyanosis or pallor.  Abdomen:  Soft, nondistended, nontender(recently received Oxycodone). No rebound or guarding. Normal bowel sounds. No appreciable masses or hepatomegaly. Rectal:  Not performed.  Msk:  Symmetrical without gross deformities. Peripheral pulses intact.  Extremities:  Without edema, no deformity or joint abnormality.  Neurologic:  Alert and  oriented x4;  grossly normal neurologically.  Skin:   Dry and intact without significant lesions or rashes. Psychiatric: Demonstrates good judgement and reason without abnormal affect or behaviors.   LAB RESULTS: Recent Labs    08/11/23 1500 08/12/23 0512  WBC 6.5 4.5  HGB 11.0* 12.7  HCT 33.8* 38.9  PLT 170 134*   BMET Recent Labs    08/11/23 1500 08/12/23 0512  NA 132* 132*  K 4.3 3.7  CL 96* 99  CO2 26 22  GLUCOSE 104* 148*  BUN 12 10  CREATININE 0.47 0.60  CALCIUM 9.5 9.1   LFT Recent Labs    08/11/23 1500  PROT 7.5  ALBUMIN 4.2  AST 117*  ALT 54*  ALKPHOS 45  BILITOT 0.9   PT/INR Recent Labs    08/12/23 0512  LABPROT 15.3*  INR 1.2  STUDIES: DG Abd 2 Views Result Date: 08/11/2023 CLINICAL DATA:   Rectal bleeding with nausea and vomiting. EXAM: ABDOMEN - 2 VIEW COMPARISON:  None Available. FINDINGS: The bowel gas pattern is normal. There is no evidence of free air. No radio-opaque calculi or other significant radiographic abnormality is seen. IMPRESSION: Negative. Electronically Signed   By: Aram Candela M.D.   On: 08/11/2023 20:41   DG CHEST PORT 1 VIEW Result Date: 08/11/2023 CLINICAL DATA:  Rectal bleeding with nausea and vomiting. EXAM: PORTABLE CHEST 1 VIEW COMPARISON:  April 02, 2016 FINDINGS: The heart size and mediastinal contours are within normal limits. Both lungs are clear. The visualized skeletal structures are unremarkable. IMPRESSION: No active disease. Electronically Signed   By: Aram Candela M.D.   On: 08/11/2023 20:40    Impression / Plan:   Impression: 1.  Hematemesis and melena: Started with vomiting of black material on 08/10/23 x 2, then had 3 episodes of melena on 08/11/2023, none since then, hemoglobin 12.7--> 11.0 overnight, did receive 1 unit PRBCs, BUN stable; consider esophagitis versus gastritis versus ulcer 2.  Epigastric pain: With above, better on Oxycodone 3.  Transaminitis: Previously evaluated, known diagnosis of alcohol related liver disease 4.  Alcohol abuse: Reports daily alcohol use will not tell me how much, this has been ongoing for years, platelets are normal, last elastography did show fibrosis but no prior diagnosis of cirrhosis  Plan: 1.  Patient scheduled for diagnostic EGD today with Dr. Doy Hutching at 1215.  Did discuss risks, benefits, limitations and alternatives and patient agrees to proceed. 2.  Patient will be n.p.o. until after time of procedure.  Diet based off of findings after that. 3.  Discussed with patient likely she will need to stay overnight pending findings 4.  Agree with Pantoprazole 40 mg IV twice daily 5.  Not sure Octreotide is needed.  May be able to discontinue after EGD pending findings. 6.  Please await further  recommendations from Dr. Doy Hutching after time of procedure.  We will continue to follow.  Thank you for your kind consultation, we will continue to follow.  Violet Baldy [facility]  08/12/2023, 9:16 AM

## 2023-08-12 NOTE — Progress Notes (Signed)
   08/12/23 1657  Assess: MEWS Score  Temp 98 F (36.7 C)  BP 134/79  MAP (mmHg) 95  Pulse Rate (!) 133  Resp 17  SpO2  (couldnt get a reading due to hands being very cold and purple informed nurse of purple fingers and gave hot pack to heat hands up)  O2 Device Room Air  Assess: MEWS Score  MEWS Temp 0  MEWS Systolic 0  MEWS Pulse 3  MEWS RR 0  MEWS LOC 0  MEWS Score 3  MEWS Score Color Yellow  Assess: if the MEWS score is Yellow or Red  Were vital signs accurate and taken at a resting state? Yes  Does the patient meet 2 or more of the SIRS criteria? No  MEWS guidelines implemented  Yes, yellow  Treat  MEWS Interventions Considered administering scheduled or prn medications/treatments as ordered  Take Vital Signs  Increase Vital Sign Frequency  Yellow: Q2hr x1, continue Q4hrs until patient remains green for 12hrs  Escalate  MEWS: Escalate Yellow: Discuss with charge nurse and consider notifying provider and/or RRT  Notify: Charge Nurse/RN  Name of Charge Nurse/RN Notified Darl Pikes  Provider Notification  Provider Name/Title Regalado  Date Provider Notified 08/12/23  Time Provider Notified 1750  Method of Notification Page  Assess: SIRS CRITERIA  SIRS Temperature  0  SIRS Respirations  0  SIRS Pulse 1  SIRS WBC 0  SIRS Score Sum  1

## 2023-08-12 NOTE — Op Note (Signed)
 [facility] Patient Name: Ana Graves Procedure Date: 08/12/2023 MRN: [id number] Attending MD: Maren Beach , MD, [id number] Date of Birth: 1955/09/09 CSN: [id number] Age: 68 Admit Type: Outpatient Procedure:                Upper GI endoscopy Indications:              Epigastric abdominal pain, Coffee-ground emesis,                            Melena, Nausea with vomiting Providers:                Maren Beach, MD, Vicki Mallet, RN, Geoffery Lyons, Technician Referring MD:              Medicines:                Monitored Anesthesia Care Complications:            No immediate complications. Estimated blood loss:                            Minimal. Estimated Blood Loss:     Estimated blood loss was minimal. Procedure:                Pre-Anesthesia Assessment:                           - Prior to the procedure, a History and Physical                            was performed, and patient medications and                            allergies were reviewed. The patient's tolerance of                            previous anesthesia was also reviewed. The risks                            and benefits of the procedure and the sedation                            options and risks were discussed with the patient.                            All questions were answered, and informed consent                            was obtained. Prior Anticoagulants: The patient has                            taken no anticoagulant or antiplatelet agents. ASA                            Grade Assessment: II -  A patient with mild systemic                            disease. After reviewing the risks and benefits,                            the patient was deemed in satisfactory condition to                            undergo the procedure.                           After obtaining informed consent, the endoscope was                            passed under  direct vision. Throughout the                            procedure, the patient's blood pressure, pulse, and                            oxygen saturations were monitored continuously. The                            [id number]-H190 ([id number] Olympus endoscope was introduced                            through the mouth, and advanced to the second part                            of duodenum. The upper GI endoscopy was                            accomplished without difficulty. The patient                            tolerated the procedure. Scope In: Scope Out: Findings:      The upper third of the esophagus was normal.      LA Grade C (one or more mucosal breaks continuous between tops of 2 or       more mucosal folds, less than 75% circumference) esophagitis was found       in the middle and lower third of the esophagus. There was associated       stenosis in the lower third of the esophagus. The tissue was not       spontaneously bleeding but did demonstrate contact friability and began       bleeding with passage of the upper endoscope.      One benign-appearing, intrinsic moderate stenosis was found in the lower       third of the esophagus at the GE junction. The stenosis permitted gentle       passage of the upper endoscope.      Localized mild inflammation characterized by erythema was found in the       gastric fundus and in the gastric body.      The gastric body  and gastric antrum were normal.      A small hiatal hernia was present.      Diffuse moderately erythematous mucosa without active bleeding and with       no stigmata of bleeding was found in the duodenal bulb.      The second portion of the duodenum was normal. Impression:               - Normal upper third of esophagus.                           - LA Grade C erosive esophagitis in the middle and                            lower esophagus. The tissue was not spontaneously                            bleeding but did demonstrate  contact friability                            with passage of the endoscope resulting in a small                            amount of oozing. Suspect this is a likely source                            of patient's coffee-ground emesis and melena.                           - Benign-appearing esophageal stenosis at the GE                            junction                           - Gastritis.                           - Normal gastric body and antrum.                           - Small hiatal hernia.                           - Erythematous duodenopathy.                           - Normal second portion of the duodenum.                           - No specimens collected. Moderate Sedation:      Not Applicable - Patient had care per Anesthesia. Recommendation:           - Return patient to hospital ward for ongoing care                           - Recommend pantoprazole 40 mg p.o. twice  daily and                            Carafate liquid slurry 1 g p.o. 4 times daily to                            coat esophagus                           - If patient is experiencing nausea and vomiting                            utilize antiemetics to reduce vomiting and chances                            of esophagitis causing bleeding.                           - Discontinue octreotide as there was no stigmata                            of portal hypertension -no varices or portal                            hypertensive gastropathy                           - Avoid NSAIDs.                           - Alcohol abstinence.                           - Patient will need a follow-up upper endoscopy in                            8 to 12 weeks for confirmation of healing of                            esophagitis and can consider esophageal dilation at                            that time if deemed appropriate. Procedure Code(s):        --- Professional ---                           [phone],  Esophagogastroduodenoscopy, flexible,                            transoral; diagnostic, including collection of                            specimen(s) by brushing or washing, when performed                            (separate procedure) Diagnosis Code(s):        ---  Professional ---                           K20.80, Other esophagitis without bleeding                           K22.2, Esophageal obstruction                           K29.70, Gastritis, unspecified, without bleeding                           K44.9, Diaphragmatic hernia without obstruction or                            gangrene                           K31.89, Other diseases of stomach and duodenum                           R10.13, Epigastric pain                           K92.0, Hematemesis                           K92.1, Melena (includes Hematochezia)                           R11.2, Nausea with vomiting, unspecified CPT copyright 2022 American Medical Association. All rights reserved. The codes documented in this report are preliminary and upon coder review may  be revised to meet current compliance requirements. Maren Beach, MD 08/12/2023 1:03:11 PM This report has been signed electronically. Number of Addenda: 0

## 2023-08-12 NOTE — TOC Initial Note (Addendum)
 Transition of Care [facility]) - Initial/Assessment Note    Patient Details  Name: Ana Graves MRN: [id number] Date of Birth: 06/20/1955  [facility] CM/SW Contact:    Howell Rucks, RN Phone Number: 08/12/2023, 12:54 PM  Clinical Narrative:   PT eval completed, recommendation for OP Vestibular PT- scheduled at [facility], added to AVS.   -2:39pm Met with pt at bedside to introduce role of TOC/NCM and review for dc planning, OP Vestibular  PT at [facility], pt agreeable, reports she has an established PCP and pharmacy, no current home care services or home DME. TOC consult for substance abuse resources, pt declined. TOC will continue to follow.                       Patient Goals and CMS Choice            Expected Discharge Plan and Services                                              Prior Living Arrangements/Services                       Activities of Daily Living   ADL Screening (condition at time of admission) Independently performs ADLs?: Yes (appropriate for developmental age) Is the patient deaf or have difficulty hearing?: No Does the patient have difficulty seeing, even when wearing glasses/contacts?: No Does the patient have difficulty concentrating, remembering, or making decisions?: No  Permission Sought/Granted                  Emotional Assessment              Admission diagnosis:  Hematemesis [K92.0] Rectal bleeding [K62.5] Symptomatic anemia [D64.9] Hematemesis with nausea [K92.0] Patient Active Problem List   Diagnosis Date Noted   Transaminitis 08/11/2023   Acute upper GI bleeding 08/11/2023   Abdominal pain 08/11/2023   Stricture and stenosis of esophagus 04/21/2022   Gastroesophageal reflux disease with esophagitis and hemorrhage 04/21/2022   Esophageal dysphagia 04/21/2022   LIPOMA, SKIN 03/14/2010   CARPAL TUNNEL SYNDROME, BILATERAL 02/04/2010   ARM  PAIN, LEFT 07/24/2009   ACTINIC KERATOSIS 10/02/2008   Acute upper respiratory infection 05/29/2008   Essential hypertension 03/22/2008   PCP:  [facility]:   [id number] - [place], [place] - [address] [place] [address] Phone: [phone] Fax: [phone]     Social Drivers of Health (SDOH) Social History: SDOH Screenings   Food Insecurity: No Food Insecurity (08/11/2023)  Housing: Low Risk  (08/11/2023)  Transportation Needs: No Transportation Needs (08/11/2023)  Utilities: Not At Risk (08/11/2023)  Social Connections: Unknown (08/12/2023)  Tobacco Use: Low Risk  (08/12/2023)   SDOH Interventions: Social Connections Interventions: Patient Declined   Readmission Risk Interventions     No data to display

## 2023-08-12 NOTE — Telephone Encounter (Signed)
-----   Message from Unk Lightning sent at 08/12/2023  9:21 AM EDT ----- Regarding: Appointment today Just an FYI this patient has an appointment today at 2:00 with Shanda Bumps but she is currently in the hospital.  You can go ahead and cancel this and fill the slot as needed.  JL L

## 2023-08-12 NOTE — Transfer of Care (Signed)
 Immediate Anesthesia Transfer of Care Note  Patient: Ana Graves  Procedure(s) Performed: EGD (ESOPHAGOGASTRODUODENOSCOPY)  Patient Location: PACU and Endoscopy Unit  Anesthesia Type:MAC  Level of Consciousness: awake, alert , and patient cooperative  Airway & Oxygen Therapy: Patient Spontanous Breathing and Patient connected to face mask oxygen  Post-op Assessment: Report given to RN and Post -op Vital signs reviewed and stable  Post vital signs: Reviewed and stable  Last Vitals:  Vitals Value Taken Time  BP 110/55 08/12/23 1300  Temp    Pulse 76 08/12/23 1300  Resp 21 08/12/23 1300  SpO2 97 % 08/12/23 1300  Vitals shown include unfiled device data.  Last Pain:  Vitals:   08/12/23 1121  TempSrc: Temporal  PainSc: 0-No pain         Complications: No notable events documented.

## 2023-08-12 NOTE — Anesthesia Preprocedure Evaluation (Signed)
 Anesthesia Evaluation  Patient identified by MRN, date of birth, ID band Patient awake    Reviewed: Allergy & Precautions, NPO status , Patient's Chart, lab work & pertinent test results  History of Anesthesia Complications (+) history of anesthetic complications (previous airway obstruction during endo procedure)  Airway Mallampati: II  TM Distance: >3 FB Neck ROM: Full    Dental  (+) Poor Dentition   Pulmonary neg pulmonary ROS   Pulmonary exam normal breath sounds clear to auscultation       Cardiovascular hypertension, Pt. on medications (-) angina (-) Past MI, (-) Cardiac Stents and (-) CABG  Rhythm:Regular Rate:Normal     Neuro/Psych  PSYCHIATRIC DISORDERS Anxiety     negative neurological ROS     GI/Hepatic ,GERD  ,,(+)     substance abuse  alcohol use  Endo/Other  negative endocrine ROS    Renal/GU negative Renal ROS     Musculoskeletal  (+) Arthritis , Osteoarthritis,    Abdominal  (+) - obese  Peds  Hematology negative hematology ROS (+)   Anesthesia Other Findings   Reproductive/Obstetrics                             Anesthesia Physical Anesthesia Plan  ASA: 2  Anesthesia Plan: MAC   Post-op Pain Management: Minimal or no pain anticipated   Induction: Intravenous  PONV Risk Score and Plan: 2 and Treatment may vary due to age or medical condition and Propofol infusion  Airway Management Planned: Nasal Cannula  Additional Equipment:   Intra-op Plan:   Post-operative Plan:   Informed Consent: I have reviewed the patients History and Physical, chart, labs and discussed the procedure including the risks, benefits and alternatives for the proposed anesthesia with the patient or authorized representative who has indicated his/her understanding and acceptance.     Dental advisory given  Plan Discussed with: CRNA and Anesthesiologist  Anesthesia Plan Comments:         Anesthesia Quick Evaluation

## 2023-08-12 NOTE — Progress Notes (Signed)
 PROGRESS NOTE    Reet Scharrer  NGE:[id number] DOB: [birth year] DOA: 08/11/2023 PCP: [facility] [place]   Brief Narrative: 68 year old with past medical history significant for hemorrhoids, GERD, rheumatoid arthritis, who had dental procedure 3 months ago, reports continued discomfort at the site since she had the procedure done.  She reports she has been vomiting at least once a day.  She presented complaining of vomiting blood, small amount.  She also report epigastric abdominal pain and at least 2 episodes of black liquid bowel movement.  Patient has been admitted for further evaluation of melena.   Assessment & Plan:   Principal Problem:   Acute upper GI bleeding Active Problems:   Transaminitis   Abdominal pain  1-Acute upper GI bleed melena hematemesis: -Patient presented with an episode of hematemesis and melena -Continue with IV fluids -Continue with IV PPI -GI has been consulted Endoscopy today.   Epigastric pain: KUB: Negative Lipase: 58 Continue with PPI twice daily  Transaminases mild Hepatitis  panel:  Hyponatremia: Monitor.   Alcohol use. Didn't want to specify amount.  CIWA ordered.   Estimated body mass index is 21.61 kg/m as calculated from the following:   Height as of this encounter: 5\' 4"  (1.626 m).   Weight as of this encounter: 57.1 kg.   DVT prophylaxis: SCD Code Status: Full code Family Communication: Husband at bedside.  Disposition Plan:  Status is: Inpatient Remains inpatient appropriate because: management of GI bleed.     Consultants:  GI  Procedures:  Endoscopy   Antimicrobials:    Subjective: Report melena. , abdominal pain is better.   Objective: Vitals:   08/12/23 0015 08/12/23 0035 08/12/23 0236 08/12/23 0550  BP: (!) 158/84 (!) 163/91 128/73 (!) 152/80  Pulse: 64 66 69 62  Resp: 18 16 18 18   Temp: 98.6 F (37 C) 98.2 F (36.8 C) 98.2 F (36.8 C) 98 F (36.7 C)  TempSrc: Oral  Oral Oral Oral  SpO2: 100% 100% 98% 98%  Weight:      Height:        Intake/Output Summary (Last 24 hours) at 08/12/2023 0711 Last data filed at 08/12/2023 4010 Gross per 24 hour  Intake 1791.79 ml  Output 0 ml  Net 1791.79 ml   Filed Weights   08/11/23 1431  Weight: 57.1 kg    Examination:  General exam: NAD  Respiratory system: Clear to auscultation. Respiratory effort normal. Cardiovascular system: S1 & S2 heard, RRR. No JVD, murmurs, rubs, gallops or clicks. No pedal edema. Gastrointestinal system: Abdomen is nondistended, soft and nontender. No organomegaly or masses felt. Normal bowel sounds heard. Central nervous system: Alert and oriented. No focal neurological deficits. Extremities: Symmetric 5 x 5 power. Skin: No rashes, lesions or ulcers Psychiatry: Judgement and insight appear normal. Mood & affect appropriate.     Data Reviewed: I have personally reviewed following labs and imaging studies  CBC: Recent Labs  Lab 08/11/23 1500 08/12/23 0512  WBC 6.5 4.5  HGB 11.0* 12.7  HCT 33.8* 38.9  MCV 106.6* 106.0*  PLT 170 134*   Basic Metabolic Panel: Recent Labs  Lab 08/11/23 1500 08/12/23 0512  NA 132* 132*  K 4.3 3.7  CL 96* 99  CO2 26 22  GLUCOSE 104* 148*  BUN 12 10  CREATININE 0.47 0.60  CALCIUM 9.5 9.1   GFR: Estimated Creatinine Clearance: 58.9 mL/min (by C-G formula based on SCr of 0.6 mg/dL). Liver Function Tests: Recent Labs  Lab 08/11/23 1500  AST 117*  ALT 54*  ALKPHOS 45  BILITOT 0.9  PROT 7.5  ALBUMIN 4.2   Recent Labs  Lab 08/11/23 2015  LIPASE 58*   No results for input(s): "AMMONIA" in the last 168 hours. Coagulation Profile: Recent Labs  Lab 08/12/23 0512  INR 1.2   Cardiac Enzymes: No results for input(s): "CKTOTAL", "CKMB", "CKMBINDEX", "TROPONINI" in the last 168 hours. BNP (last 3 results) No results for input(s): "PROBNP" in the last 8760 hours. HbA1C: No results for input(s): "HGBA1C" in the last 72  hours. CBG: No results for input(s): "GLUCAP" in the last 168 hours. Lipid Profile: No results for input(s): "CHOL", "HDL", "LDLCALC", "TRIG", "CHOLHDL", "LDLDIRECT" in the last 72 hours. Thyroid Function Tests: No results for input(s): "TSH", "T4TOTAL", "FREET4", "T3FREE", "THYROIDAB" in the last 72 hours. Anemia Panel: No results for input(s): "VITAMINB12", "FOLATE", "FERRITIN", "TIBC", "IRON", "RETICCTPCT" in the last 72 hours. Sepsis Labs: No results for input(s): "PROCALCITON", "LATICACIDVEN" in the last 168 hours.  No results found for this or any previous visit (from the past 240 hours).       Radiology Studies: DG Abd 2 Views Result Date: 08/11/2023 CLINICAL DATA:  Rectal bleeding with nausea and vomiting. EXAM: ABDOMEN - 2 VIEW COMPARISON:  None Available. FINDINGS: The bowel gas pattern is normal. There is no evidence of free air. No radio-opaque calculi or other significant radiographic abnormality is seen. IMPRESSION: Negative. Electronically Signed   By: Aram Candela M.D.   On: 08/11/2023 20:41   DG CHEST PORT 1 VIEW Result Date: 08/11/2023 CLINICAL DATA:  Rectal bleeding with nausea and vomiting. EXAM: PORTABLE CHEST 1 VIEW COMPARISON:  April 02, 2016 FINDINGS: The heart size and mediastinal contours are within normal limits. Both lungs are clear. The visualized skeletal structures are unremarkable. IMPRESSION: No active disease. Electronically Signed   By: Aram Candela M.D.   On: 08/11/2023 20:40        Scheduled Meds:  melatonin  3 mg Oral QHS   pantoprazole (PROTONIX) IV  40 mg Intravenous Q12H   sodium chloride flush  3 mL Intravenous Q12H   Continuous Infusions:  sodium chloride 100 mL/hr at 08/11/23 2306   octreotide (SANDOSTATIN) 500 mcg in sodium chloride 0.9 % 250 mL (2 mcg/mL) infusion 50 mcg/hr (08/12/23 0009)     LOS: 1 day    Time spent: 35 minutes.     Alba Cory, MD [facility]   If 7PM-7AM, please contact  night-coverage [email or web address]  08/12/2023, 7:11 AM

## 2023-08-12 NOTE — Plan of Care (Signed)

## 2023-08-12 NOTE — Evaluation (Signed)
 Physical Therapy Evaluation Only Patient Details Name: Ana Graves MRN: [id number] DOB: 1955/06/25 Today's Date: 08/12/2023  History of Present Illness  Ana Graves is a 68 y.o. female admitted with acute upper GI bleeding. PMH: anxiety, RA, skin cancer, GERD  Clinical Impression  Pt ind at baseline, endorses 5 falls in the last 6 months post dental procedure. Pt reports has seen ENT and audiologist who performed testing and reported everything was "normal", but pt reports the ongoing pain is still present which is causing her to fall and she has an endodontist appointment she is missing today. On eval, pt ind with transfers, no AD, able to don hospital gown in standing without LOB. With amb, pt initially using RW with straightline gait and normal cadence. Pt progressed to no AD and noted to have intermittent narrow BOS, drifting R/L with head turns R/L in busy hallway, also challenged by external moving objects (people, chairs, carts, etc) but able to recover without physical assistance, supv for safety. Educated pt on time OOB while in hospital, ambulating with nursing and mobility team as able and pt verbalizes understanding. Pt is open to OPPT for vestibular eval and high level balance training.          If plan is discharge home, recommend the following: Assistance with cooking/housework;Assist for transportation;Help with stairs or ramp for entrance   Can travel by private vehicle        Equipment Recommendations None recommended by PT  Recommendations for Other Services       Functional Status Assessment Patient has not had a recent decline in their functional status     Precautions / Restrictions Precautions Precautions: Fall Precaution/Restrictions Comments: pt reports 5 falls in last 6 months after dental procedure, reports due to inner ear imbalances, 4 falls outside and 1 while entering bathroom Restrictions Weight Bearing Restrictions Per Provider  Order: No      Mobility  Bed Mobility               General bed mobility comments: in recliner upon arrival    Transfers Overall transfer level: Independent                 General transfer comment: BUE assisting to power up, able to rotate trunk and don 2nd hospital gown as jacket without UE support    Ambulation/Gait Ambulation/Gait assistance: Supervision Gait Distance (Feet): 500 Feet Assistive device: Rolling walker (2 wheels), None Gait Pattern/deviations: Step-through pattern, Decreased stride length Gait velocity: WFL     General Gait Details: pt ambulates initial 60 ft with RW, equal step length, step through gait pattern, no drifting/veering, good steadiness; completed gait distance without AD, noted to have intermittent narrow BOS, drifts R/L with head turns R/L, mild LOB with external moving objects but able to recover without physical assist  Stairs            Wheelchair Mobility     Tilt Bed    Modified Rankin (Stroke Patients Only)       Balance Overall balance assessment: Mild deficits observed, not formally tested, History of Falls                                           Pertinent Vitals/Pain Pain Assessment Pain Assessment: No/denies pain    Home Living Family/patient expects to be discharged to:: Private residence Living Arrangements: Spouse/significant other Available Help  at Discharge: Family;Available 24 hours/day Type of Home: House Home Access: Stairs to enter   Entrance Stairs-Number of Steps: 3 Alternate Level Stairs-Number of Steps: flight Home Layout: Two level;Able to live on main level with bedroom/bathroom Home Equipment: Rolling Walker (2 wheels)      Prior Function Prior Level of Function : Independent/Modified Independent;Driving             Mobility Comments: pt reports ind ADLs Comments: pt reports ind     Extremity/Trunk Assessment   Upper Extremity Assessment Upper  Extremity Assessment: Overall WFL for tasks assessed    Lower Extremity Assessment Lower Extremity Assessment: Overall WFL for tasks assessed    Cervical / Trunk Assessment Cervical / Trunk Assessment: Normal  Communication   Communication Communication: No apparent difficulties    Cognition Arousal: Alert Behavior During Therapy: WFL for tasks assessed/performed   PT - Cognitive impairments: No apparent impairments                         Following commands: Intact       Cueing       General Comments      Exercises     Assessment/Plan    PT Assessment All further PT needs can be met in the next venue of care  PT Problem List Decreased balance       PT Treatment Interventions      PT Goals (Current goals can be found in the Care Plan section)  Acute Rehab PT Goals Patient Stated Goal: return home, open to OPPT PT Goal Formulation: All assessment and education complete, DC therapy    Frequency       Co-evaluation               AM-PAC PT "6 Clicks" Mobility  Outcome Measure Help needed turning from your back to your side while in a flat bed without using bedrails?: None Help needed moving from lying on your back to sitting on the side of a flat bed without using bedrails?: None Help needed moving to and from a bed to a chair (including a wheelchair)?: None Help needed standing up from a chair using your arms (e.g., wheelchair or bedside chair)?: None Help needed to walk in hospital room?: A Little Help needed climbing 3-5 steps with a railing? : A Little 6 Click Score: 22    End of Session Equipment Utilized During Treatment: Gait belt Activity Tolerance: Patient tolerated treatment well Patient left: in chair;with call bell/phone within reach;with family/visitor present Nurse Communication: Mobility status PT Visit Diagnosis: History of falling (Z91.81)    Time: 0940-1004 PT Time Calculation (min) (ACUTE ONLY): 24 min   Charges:    PT Evaluation $PT Eval Low Complexity: 1 Low PT Treatments $Gait Training: 8-22 mins PT General Charges $$ ACUTE PT VISIT: 1 Visit         Tori Rayaan Lorah PT, DPT 08/12/23, 10:15 AM

## 2023-08-12 NOTE — Telephone Encounter (Signed)
 Appt has been cancelled.

## 2023-08-13 DIAGNOSIS — K922 Gastrointestinal hemorrhage, unspecified: Secondary | ICD-10-CM | POA: Diagnosis not present

## 2023-08-13 LAB — CBC
HCT: 35.2 % — ABNORMAL LOW (ref 36.0–46.0)
Hemoglobin: 11.8 g/dL — ABNORMAL LOW (ref 12.0–15.0)
MCH: 34.9 pg — ABNORMAL HIGH (ref 26.0–34.0)
MCHC: 33.5 g/dL (ref 30.0–36.0)
MCV: 104.1 fL — ABNORMAL HIGH (ref 80.0–100.0)
Platelets: 130 10*3/uL — ABNORMAL LOW (ref 150–400)
RBC: 3.38 MIL/uL — ABNORMAL LOW (ref 3.87–5.11)
RDW: 12.6 % (ref 11.5–15.5)
WBC: 5.6 10*3/uL (ref 4.0–10.5)
nRBC: 0 % (ref 0.0–0.2)

## 2023-08-13 LAB — HEPATIC FUNCTION PANEL
ALT: 65 U/L — ABNORMAL HIGH (ref 0–44)
AST: 144 U/L — ABNORMAL HIGH (ref 15–41)
Albumin: 4.3 g/dL (ref 3.5–5.0)
Alkaline Phosphatase: 44 U/L (ref 38–126)
Bilirubin, Direct: 0.3 mg/dL — ABNORMAL HIGH (ref 0.0–0.2)
Indirect Bilirubin: 1 mg/dL — ABNORMAL HIGH (ref 0.3–0.9)
Total Bilirubin: 1.3 mg/dL — ABNORMAL HIGH (ref 0.0–1.2)
Total Protein: 7.6 g/dL (ref 6.5–8.1)

## 2023-08-13 MED ORDER — VITAMIN B-1 100 MG PO TABS: 100.0000 mg | ORAL_TABLET | Freq: Every day | ORAL | 0 refills | Status: AC

## 2023-08-13 MED ORDER — FOLIC ACID 1 MG PO TABS: 1.0000 mg | ORAL_TABLET | Freq: Every day | ORAL | 0 refills | Status: AC

## 2023-08-13 MED ORDER — SUCRALFATE 1 GM/10ML PO SUSP: 1.0000 g | Freq: Three times a day (TID) | ORAL | 0 refills | Status: DC

## 2023-08-13 MED ORDER — PANTOPRAZOLE SODIUM 40 MG PO TBEC: 40.0000 mg | DELAYED_RELEASE_TABLET | Freq: Two times a day (BID) | ORAL | 3 refills | Status: DC

## 2023-08-13 NOTE — Progress Notes (Signed)
 Mobility Specialist - Progress Note   08/13/23 0958  Mobility  Activity Ambulated independently in hallway  Level of Assistance Independent  Assistive Device None  Distance Ambulated (ft) 500 ft  Activity Response Tolerated well  Mobility Referral Yes  Mobility visit 1 Mobility  Mobility Specialist Start Time (ACUTE ONLY) 0948  Mobility Specialist Stop Time (ACUTE ONLY) [id number]  Mobility Specialist Time Calculation (min) (ACUTE ONLY) 4 min   Pt received in bed and agreeable to mobility. No complaints during session. Pt to bed after session with all needs met.    [facility]

## 2023-08-13 NOTE — Discharge Summary (Signed)
 Physician Discharge Summary   Patient: Ana Graves MRN: [id number] DOB: March 15, 1956  Admit date:     08/11/2023  Discharge date: 08/13/23  Discharge Physician: Alba Cory   PCP: [facility] [place]   Recommendations at discharge:    Needs close follow up with GI for repeat endoscopy, to follow up on resolution of esophagitis.  Needs CBC and Bmet.  Continue counseling in regards alcohol intake.   Discharge Diagnoses: Principal Problem:   Acute upper GI bleeding Active Problems:   Transaminitis   Abdominal pain   Coffee ground emesis   Melena  Resolved Problems:   * No resolved hospital problems. *  Hospital Course: 68 year old with past medical history significant for hemorrhoids, GERD, rheumatoid arthritis, who had dental procedure 3 months ago, reports continued discomfort at the site since she had the procedure done. She reports she has been vomiting at least once a day. She presented complaining of vomiting blood, small amount. She also report epigastric abdominal pain and at least 2 episodes of black liquid bowel movement. Patient has been admitted for further evaluation of melena.   Assessment and Plan: 1-Acute upper GI bleed melena hematemesis: in setting severe grade C esophagitis.  -Patient presented with an episode of hematemesis and melena -Continue with IV fluids -Continue with IV PPI -GI has been consulted -endoscopy showed grade C esophagitis, benign appearing esophageal stenosis, gastritis. Small hiatal hernia.  -Discharge on PPI and Carafate.   Epigastric pain: KUB: Negative Lipase: 58 Continue with PPI twice daily   Transaminases mild Hepatitis  panel: Non reactive   Hyponatremia: Monitor.    Alcohol use. Didn't want to specify amount.  CIWA ordered. No evidence of withdrawal.    Estimated body mass index is 21.61 kg/m as calculated from the following:   Height as of this encounter: 5\' 4"  (1.626 m).   Weight  as of this encounter: 57.1 kg.        Consultants: GI Procedures performed: Endoscopy   Disposition: Home Diet recommendation:  Discharge Diet Orders (From admission, onward)     Start     Ordered   08/13/23 0000  Diet - low sodium heart healthy        08/13/23 1128           Cardiac diet DISCHARGE MEDICATION: Allergies as of 08/13/2023       Reactions   Fluorouracil Other (See Comments)   Doxycycline Nausea And Vomiting   Hydroxychloroquine Nausea And Vomiting   Other Nausea And Vomiting   ANY Antibiotics (quins and cillins)        Medication List     TAKE these medications    calcium carbonate 500 MG chewable tablet Commonly known as: TUMS - dosed in mg elemental calcium Chew 1 tablet by mouth daily as needed for indigestion or heartburn.   folic acid 1 MG tablet Commonly known as: FOLVITE Take 1 tablet (1 mg total) by mouth daily. Start taking on: August 14, 2023   Humira (2 Pen) 40 MG/0.4ML pen Generic drug: adalimumab Inject 40 mg into the skin every 14 (fourteen) days.   multivitamin tablet Take 1 tablet by mouth 3 (three) times a week.   pantoprazole 40 MG tablet Commonly known as: PROTONIX Take 1 tablet (40 mg total) by mouth 2 (two) times daily.   sertraline 50 MG tablet Commonly known as: ZOLOFT Take 50 mg by mouth daily.   sucralfate 1 GM/10ML suspension Commonly known as: CARAFATE Take 10 mLs (1  g total) by mouth 4 (four) times daily -  with meals and at bedtime.   thiamine 100 MG tablet Commonly known as: Vitamin B-1 Take 1 tablet (100 mg total) by mouth daily. Start taking on: August 14, 2023        Follow-up Information     [facility] Follow up.   Specialty: Rehabilitation Why: Vestibular Physical Therapy Contact information: [address] [place] [address] [phone]        [facility] [place] Follow up in 1 week(s).   Specialty:  Family Medicine Contact information: [address] [place] [address] [phone]                Discharge Exam: Filed Weights   08/11/23 1431  Weight: 57.1 kg  General; NAD  Condition at discharge: stable  The results of significant diagnostics from this hospitalization (including imaging, microbiology, ancillary and laboratory) are listed below for reference.   Imaging Studies: DG Abd 2 Views Result Date: 08/11/2023 CLINICAL DATA:  Rectal bleeding with nausea and vomiting. EXAM: ABDOMEN - 2 VIEW COMPARISON:  None Available. FINDINGS: The bowel gas pattern is normal. There is no evidence of free air. No radio-opaque calculi or other significant radiographic abnormality is seen. IMPRESSION: Negative. Electronically Signed   By: Aram Candela M.D.   On: 08/11/2023 20:41   DG CHEST PORT 1 VIEW Result Date: 08/11/2023 CLINICAL DATA:  Rectal bleeding with nausea and vomiting. EXAM: PORTABLE CHEST 1 VIEW COMPARISON:  April 02, 2016 FINDINGS: The heart size and mediastinal contours are within normal limits. Both lungs are clear. The visualized skeletal structures are unremarkable. IMPRESSION: No active disease. Electronically Signed   By: Aram Candela M.D.   On: 08/11/2023 20:40   CT HEAD WO CONTRAST ( ) Result Date: 08/03/2023 CLINICAL DATA:  Fall 6 days ago with forehead injury. EXAM: CT HEAD WITHOUT CONTRAST TECHNIQUE: Contiguous axial images were obtained from the base of the skull through the vertex without intravenous contrast. RADIATION DOSE REDUCTION: This exam was performed according to the departmental dose-optimization program which includes automated exposure control, adjustment of the mA and/or kV according to patient size and/or use of iterative reconstruction technique. COMPARISON:  None Available. FINDINGS: Brain: No evidence of acute infarction, hemorrhage, hydrocephalus, extra-axial collection or mass lesion/mass effect. Generalized brain atrophy  Vascular: No hyperdense vessel or unexpected calcification. Skull: Negative for fracture Sinuses/Orbits: No acute finding. IMPRESSION: No evidence of intracranial injury.  Negative for facial fracture. Electronically Signed   By: Tiburcio Pea M.D.   On: 08/03/2023 12:46   DG BONE DENSITY (DXA) Result Date: 08/03/2023 EXAM: DUAL X-RAY ABSORPTIOMETRY (DXA) FOR BONE MINERAL DENSITY IMPRESSION: Referring Physician:  VAISHALI MODY Your patient completed a bone mineral density test using GE Lunar iDXA system (analysis version: 16). Technologist: BEC PATIENT: Name: Millie, Forde Patient ID: [id number] Birth Date: 18-Dec-1955 Height: 63.5 in. Sex: Female Measured: 08/03/2023 Weight: 117.6 lbs. Indications: Caucasian, Estrogen Deficient, Height Loss (781.91), History of Fracture (Adult) (V15.51), Humira, Multiple broken bones, Postmenopausal, Rheumatoid Arthritis (714.0), Zoloft Fractures: Left Wrist, Right Ankle, Right Wrist Treatments: ASSESSMENT: The BMD measured at Femur Neck Left is 0.936 g/cm2 with a T-score of -0.7. This patient's diagnostic category is NORMAL according to World Health Organization [facility]) criteria. The lumbar spine was excluded due to being excluded from the prior exam. The quality of the exam is good. Site Region Measured Date Measured Age YA BMD Significant CHANGE T-score DualFemur Neck  Left 8/03/2023 67.7 -0.7 0.936 g/cm2 * DualFemur Neck Left 04/13/2019 63.4 -0.1 1.018 g/cm2 * DualFemur Total Mean 08/03/2023 67.7 0.8 1.104 g/cm2 DualFemur Total Mean 04/13/2019 63.4 0.8 1.111 g/cm2 Left Forearm Radius 33% 08/03/2023 67.7 -0.6 0.826 g/cm2 Left Forearm Radius 33% 04/13/2019 63.4 -0.2 0.867 g/cm2 World Health Organization [facility]) criteria for post-menopausal, Caucasian Women: Normal       T-score at or above -1 SD Osteopenia   T-score between -1 and -2.5 SD Osteoporosis T-score at or below -2.5 SD RECOMMENDATION: 1. All patients should optimize calcium and vitamin D intake. 2. Consider  FDA-approved medical therapies in postmenopausal women and men aged 30 years and older, based on the following: a. A hip or vertebral (clinical or morphometric) fracture. b. T-score = -2.5 at the femoral neck or spine after appropriate evaluation to exclude secondary causes. c. Low bone mass (T-score between -1.0 and -2.5 at the femoral neck or spine) and a 10-year probability of a hip fracture = 3% or a 10-year probability of a major osteoporosis-related fracture = 20% based on the [place]-adapted WHO algorithm. d. Clinician judgment and/or patient preferences may indicate treatment for people with 10-year fracture probabilities above or below these levels. FOLLOW-UP: Patients with diagnosis of osteoporosis or at high risk for fracture should have regular bone mineral density tests.? Patients eligible for Medicare are allowed routine testing every 2 years.? The testing frequency can be increased to one year for patients who have rapidly progressing disease, are receiving or discontinuing medical therapy to restore bone mass, or have additional risk factors. I have reviewed this study and agree with the findings. [facility] Electronically Signed   By: Harmon Pier M.D.   On: 08/03/2023 09:58    Microbiology: Results for orders placed or performed in visit on 05/01/19  SARS Coronavirus 2 (TAT 6-24 hrs)     Status: None   Collection Time: 05/01/19 12:00 AM  Result Value Ref Range Status   SARS Coronavirus 2 RESULT:  NEGATIVE  Final    Comment: RESULT:  NEGATIVESARS-CoV-2 INTERPRETATION:A NEGATIVE  test result means that SARS-CoV-2 RNA was not present in the specimen above the limit of detection of this test. This does not preclude a possible SARS-CoV-2 infection and should not be used as the  sole basis for patient management decisions. Negative results must be combined with clinical observations, patient history, and epidemiological information. Optimum specimen types and timing for peak viral  levels during infections caused by SARS-CoV-2  have not been determined. Collection of multiple specimens or types of specimens may be necessary to detect virus. Improper specimen collection and handling, sequence variability under primers/probes, or organism present below the limit of detection may  lead to false negative results. Positive and negative predictive values of testing are highly dependent on prevalence. False negative test results are more likely when prevalence of disease is high.The expected result is NEGATIVE.Fact  Sheet for  Healthcare Providers: quierodirigir.com.Fact Sheet for Patients: HairSlick.no.Normal Reference Range - Negative     Labs: CBC: Recent Labs  Lab 08/11/23 1500 08/12/23 0512 08/12/23 0808 08/12/23 1454 08/12/23 2336 08/13/23 0811  WBC 6.5 4.5  --   --   --  5.6  HGB 11.0* 12.7 11.0* 11.1* 11.6* 11.8*  HCT 33.8* 38.9 33.5* 34.3* 35.6* 35.2*  MCV 106.6* 106.0*  --   --   --  104.1*  PLT 170 134*  --   --   --  130*   Basic Metabolic Panel: Recent Labs  Lab 08/11/23 1500 08/12/23 0512  NA 132* 132*  K 4.3 3.7  CL 96* 99  CO2 26 22  GLUCOSE 104* 148*  BUN 12 10  CREATININE 0.47 0.60  CALCIUM 9.5 9.1   Liver Function Tests: Recent Labs  Lab 08/11/23 1500 08/13/23 0444  AST 117* 144*  ALT 54* 65*  ALKPHOS 45 44  BILITOT 0.9 1.3*  PROT 7.5 7.6  ALBUMIN 4.2 4.3   CBG: No results for input(s): "GLUCAP" in the last 168 hours.  Discharge time spent: greater than 30 minutes.  Signed: Alba Cory, MD [facility] 08/13/2023

## 2023-08-13 NOTE — Progress Notes (Signed)
 Reviewed d/c instructions with pt. All questions answered. Pt taken to front entrance to meet ride.

## 2023-08-13 NOTE — Progress Notes (Signed)
 Progress Note   Subjective  Hospital day #2 Chief Complaint: Upper GI bleed  Status post EGD 08/12/2023 with grade C erosive esophagitis and gastritis  Hemoglobin 11.6--> 11.8 overnight  Patient denies any further upper GI bleeding.  She is aware that she needs to try and decrease her alcoholic intake which is likely causing this esophagitis and gastritis.  She has some questions about purple fingers this morning and when she will be able to go home.   Objective   Vital signs in last 24 hours: Temp:  [97.5 F (36.4 C)-98.2 F (36.8 C)] 98.1 F (36.7 C) (03/21 1009) Pulse Rate:  [58-133] 66 (03/21 1009) Resp:  [10-21] 20 (03/21 0535) BP: (110-153)/(55-87) 136/74 (03/21 1009) SpO2:  [92 %-100 %] 93 % (03/21 1009) Last BM Date : 08/11/23 General:    white female in NAD Heart:  Regular rate and rhythm; no murmurs Lungs: Respirations even and unlabored, lungs CTA bilaterally Abdomen:  Soft, nontender and nondistended. Normal bowel sounds. Psych:  Cooperative. Normal mood and affect.  Intake/Output from previous day: 03/20 0701 - 03/21 0700 In: 3938.3 [P.O.:1620; I.V.:2318.3] Out: 2850 [Urine:2850] Intake/Output this shift: Total I/O In: 208 [P.O.:120; I.V.:88] Out: -   Lab Results: Recent Labs    08/11/23 1500 08/12/23 0512 08/12/23 0808 08/12/23 1454 08/12/23 2336 08/13/23 0811  WBC 6.5 4.5  --   --   --  5.6  HGB 11.0* 12.7   < > 11.1* 11.6* 11.8*  HCT 33.8* 38.9   < > 34.3* 35.6* 35.2*  PLT 170 134*  --   --   --  130*   < > = values in this interval not displayed.   BMET Recent Labs    08/11/23 1500 08/12/23 0512  NA 132* 132*  K 4.3 3.7  CL 96* 99  CO2 26 22  GLUCOSE 104* 148*  BUN 12 10  CREATININE 0.47 0.60  CALCIUM 9.5 9.1   LFT Recent Labs    08/13/23 0444  PROT 7.6  ALBUMIN 4.3  AST 144*  ALT 65*  ALKPHOS 44  BILITOT 1.3*  BILIDIR 0.3*  IBILI 1.0*   PT/INR Recent Labs    08/12/23 0512  LABPROT 15.3*  INR 1.2     Studies/Results: DG Abd 2 Views Result Date: 08/11/2023 CLINICAL DATA:  Rectal bleeding with nausea and vomiting. EXAM: ABDOMEN - 2 VIEW COMPARISON:  None Available. FINDINGS: The bowel gas pattern is normal. There is no evidence of free air. No radio-opaque calculi or other significant radiographic abnormality is seen. IMPRESSION: Negative. Electronically Signed   By: Aram Candela M.D.   On: 08/11/2023 20:41   DG CHEST PORT 1 VIEW Result Date: 08/11/2023 CLINICAL DATA:  Rectal bleeding with nausea and vomiting. EXAM: PORTABLE CHEST 1 VIEW COMPARISON:  April 02, 2016 FINDINGS: The heart size and mediastinal contours are within normal limits. Both lungs are clear. The visualized skeletal structures are unremarkable. IMPRESSION: No active disease. Electronically Signed   By: Aram Candela M.D.   On: 08/11/2023 20:40   EGD 08/12/2023 Findings:      The upper third of the esophagus was normal.      LA Grade C (one or more mucosal breaks continuous between tops of 2 or       more mucosal folds, less than 75% circumference) esophagitis was found       in the middle and lower third of the esophagus. There was associated       stenosis  in the lower third of the esophagus. The tissue was not       spontaneously bleeding but did demonstrate contact friability and began       bleeding with passage of the upper endoscope.      One benign-appearing, intrinsic moderate stenosis was found in the lower       third of the esophagus at the GE junction. The stenosis permitted gentle       passage of the upper endoscope.      Localized mild inflammation characterized by erythema was found in the       gastric fundus and in the gastric body.      The gastric body and gastric antrum were normal.      A small hiatal hernia was present.      Diffuse moderately erythematous mucosa without active bleeding and with       no stigmata of bleeding was found in the duodenal bulb.      The second portion of  the duodenum was normal. Impression:               - Normal upper third of esophagus.                           - LA Grade C erosive esophagitis in the middle and                            lower esophagus. The tissue was not spontaneously                            bleeding but did demonstrate contact friability                            with passage of the endoscope resulting in a small                            amount of oozing. Suspect this is a likely source                            of patient's coffee-ground emesis and melena.                           - Benign-appearing esophageal stenosis at the GE                            junction                           - Gastritis.                           - Normal gastric body and antrum.                           - Small hiatal hernia.                           - Erythematous duodenopathy.                           -  Normal second portion of the duodenum.                           - No specimens collected. Moderate Sedation:      Not Applicable - Patient had care per Anesthesia. Recommendation:           - Return patient to hospital ward for ongoing care                           - Recommend pantoprazole 40 mg p.o. twice daily and                            Carafate liquid slurry 1 g p.o. 4 times daily to                            coat esophagus                           - If patient is experiencing nausea and vomiting                            utilize antiemetics to reduce vomiting and chances                            of esophagitis causing bleeding.                           - Discontinue octreotide as there was no stigmata                            of portal hypertension -no varices or portal                            hypertensive gastropathy                           - Avoid NSAIDs.                           - Alcohol abstinence.                           - Patient will need a follow-up upper endoscopy in                             8 to 12 weeks for confirmation of healing of                            esophagitis and can consider esophageal dilation at                            that time if deemed appropriate.    Assessment / Plan:   Assessment: 1.  Hematemesis and melena: Started with vomiting of a black material on 08/10/23 x 2, then 3 episodes of melena on 08/11/2023, hemoglobin 12.7--> 11.0 while  here did receive 1 unit PRBCs, BUN stable, EGD 3/20 with erosive esophagitis thought to be the source, likely related to alcohol intake 2.  Epigastric pain: With above 3.  Transaminitis: Due to known diagnosis of alcohol related liver disease 4.  Alcohol abuse  Plan: 1.  Continue Pantoprazole 40 mg p.o. twice daily for 8 weeks and Carafate slurry 1 g p.o. 4 times daily for 4 weeks. 2.  Avoid NSAIDs and discussed alcohol abstinence 3.  Patient will be set up for follow-up in our outpatient clinic in the next 4 to 6 weeks.  At that time can discuss repeat upper EGD 8 to 12 weeks from now for confirmation of healing of esophagitis, could consider dilation at the time as well if having any issues with dysphagia 4.  Discussed with patient that she needs to contact her PCP, they likely need to follow-up with her and do a recheck of CBC in the next couple of weeks, she can discuss purple fingers with them, discussed possibility of Raynaud's versus PAD versus other  Patient can be discharged from a GI standpoint.    LOS: 2 days   Unk Lightning  08/13/2023, 12:18 PM

## 2023-08-15 ENCOUNTER — Encounter (HOSPITAL_COMMUNITY): Payer: Self-pay | Admitting: Pediatrics

## 2023-08-18 ENCOUNTER — Other Ambulatory Visit: Payer: Self-pay

## 2023-08-18 ENCOUNTER — Ambulatory Visit: Attending: Internal Medicine

## 2023-08-18 DIAGNOSIS — R2681 Unsteadiness on feet: Secondary | ICD-10-CM | POA: Diagnosis present

## 2023-08-18 DIAGNOSIS — R42 Dizziness and giddiness: Secondary | ICD-10-CM | POA: Insufficient documentation

## 2023-08-18 DIAGNOSIS — D649 Anemia, unspecified: Secondary | ICD-10-CM | POA: Insufficient documentation

## 2023-08-18 NOTE — Therapy (Signed)
 OUTPATIENT PHYSICAL THERAPY VESTIBULAR EVALUATION     Patient Name: Ana Graves MRN: [id number] DOB:06-12-1955, 68 y.o., female Today's Date: 08/18/2023  END OF SESSION:  PT End of Session - 08/18/23 1143     Visit Number 1    Authorization Type Medicare/Tricare    PT Start Time 1145    PT Stop Time 1230    PT Time Calculation (min) 45 min             Past Medical History:  Diagnosis Date   Anxiety    Arthritis    Rhuematoid arthritis   Blood transfusion without reported diagnosis 2007   Cancer [facility]) 2012   skin cancer; face   Complication of anesthesia    airway obstruction during endo procedure   Elevated LFTs    GERD (gastroesophageal reflux disease)    Hemorrhoids    History of colon polyps    Hypoglycemia    Rosacea    Past Surgical History:  Procedure Laterality Date   BIOPSY  04/21/2022   Procedure: BIOPSY;  Surgeon: Napoleon Form, MD;  Location: [facility];  Service: Gastroenterology;;   ESOPHAGOGASTRODUODENOSCOPY N/A 04/21/2022   Procedure: ESOPHAGOGASTRODUODENOSCOPY (EGD);  Surgeon: Napoleon Form, MD;  Location: [facility];  Service: Gastroenterology;  Laterality: N/A;   ESOPHAGOGASTRODUODENOSCOPY N/A 08/12/2023   Procedure: EGD (ESOPHAGOGASTRODUODENOSCOPY);  Surgeon: Ottie Glazier, MD;  Location: [facility];  Service: Gastroenterology;  Laterality: N/A;   KNEE ARTHROSCOPY Left 1994   MOHS SURGERY  2012   OTHER SURGICAL HISTORY  1996   surgery for skin cancer   TUBAL LIGATION  1995   UTERINE FIBROID SURGERY  2004   Patient Active Problem List   Diagnosis Date Noted   Coffee ground emesis 08/12/2023   Melena 08/12/2023   Transaminitis 08/11/2023   Acute upper GI bleeding 08/11/2023   Abdominal pain 08/11/2023   Stricture and stenosis of esophagus 04/21/2022   Gastroesophageal reflux disease with esophagitis and hemorrhage 04/21/2022   Esophageal dysphagia 04/21/2022   LIPOMA, SKIN 03/14/2010   CARPAL TUNNEL  SYNDROME, BILATERAL 02/04/2010   ARM PAIN, LEFT 07/24/2009   ACTINIC KERATOSIS 10/02/2008   Acute upper respiratory infection 05/29/2008   Essential hypertension 03/22/2008    PCP: Jackelyn Poling, DO REFERRING PROVIDER: Alba Cory, MD  REFERRING DIAG: D64.9 (ICD-10-CM) - Symptomatic anemia  THERAPY DIAG:  No diagnosis found.  ONSET DATE:   Rationale for Evaluation and Treatment: Rehabilitation  SUBJECTIVE:   SUBJECTIVE STATEMENT: 3 months ago had crowns placed and since that time she has been experiencing right ear pain/tinnitus (constant) and dizziness since that time and has had 5 falls with one event falling forward striking forehead requiring stitches.  Notes feeling off-balanced and tripping over rugs which is unusual.  Has not noticed any pattern to the dizziness.  Recently she was hospitalized for upper GI bleed and this is being medically managed.   Pt reports her symptoms are more unstable and off-balanced rather than dizzy and episodes are fleeting and has not been able to identify any particular pattern. Notes symptoms are usually absent when sitting, more prominent when standing/walking.  Does not notice any overt lightheadedness when arising or change of position  Pt accompanied by: significant other  PERTINENT HISTORY: past medical history significant for hemorrhoids, GERD, rheumatoid arthritis,-Acute upper GI bleed melena hematemesis: in setting severe grade C esophagitis.  PAIN:  Are you having pain? No  PRECAUTIONS: Fall  RED FLAGS: None   WEIGHT BEARING RESTRICTIONS: No  FALLS:  Has patient fallen in last 6 months? Yes. Number of falls 5  LIVING ENVIRONMENT: Lives with: lives with their spouse Lives in: House/apartment Stairs: Yes: External: 4-5 steps; ground-floor set-up Has following equipment at home: None  PLOF: Independent  PATIENT GOALS:   OBJECTIVE:  Note: Objective measures were completed at Evaluation unless otherwise  noted.  DIAGNOSTIC FINDINGS: reports CT scan after recent fall and striking forehead  COGNITION: Overall cognitive status: Within functional limits for tasks assessed   SENSATION: WFL  EDEMA:  none  MUSCLE TONE:  normal  DTRs:  NT  POSTURE:  No Significant postural limitations  Cervical ROM:    Active A/PROM (deg) eval  Flexion WNL  Extension WNL  Right lateral flexion WNL  Left lateral flexion WNL  Right rotation WNL  Left rotation WNL  (Blank rows = not tested)  STRENGTH: NT  LOWER EXTREMITY MMT:   COORDINATION:  rapid, alternating movements WNL; heel to shin WNL; finger to nose WNL  BED MOBILITY:  indep  TRANSFERS: Independent    GAIT: Gait pattern: WFL Distance walked:  Assistive device utilized: None Level of assistance: Complete Independence Comments:     PATIENT SURVEYS:  DHI 12%  VESTIBULAR ASSESSMENT:  GENERAL OBSERVATION: wears progressive lens bifocals   SYMPTOM BEHAVIOR:  Subjective history: 3 month onset of tinnitus and off-balance since dental procedure  Non-Vestibular symptoms: headaches and tinnitus  Type of dizziness: Imbalance (Disequilibrium)  Frequency: daily  Duration: while in motion, fleeting episodes  Aggravating factors:  only experienced during mobility/walking  Relieving factors: slow movements  Progression of symptoms: unchanged  OCULOMOTOR EXAM:  Ocular Alignment: normal  Ocular ROM: No Limitations  Spontaneous Nystagmus: absent, saccadic intrusions--difficulty maintaining fixed gaze  Gaze-Induced Nystagmus:  it was difficult to tell but there were instances of nystagmus at end-range   Smooth Pursuits: intact  Saccades: intact  Convergence/Divergence: 3 cm     VESTIBULAR - OCULAR REFLEX:   Slow VOR: Normal  VOR Cancellation: Normal  Head-Impulse Test: HIT Right: negative HIT Left: negative  Dynamic Visual Acuity: Not able to be assessed   POSITIONAL TESTING: Right Dix-Hallpike: saccadic  intrusions Left Dix-Hallpike: saccadic intrusions Right Roll Test: saccadic intrusions Left Roll Test: saccadic instrusions  MOTION SENSITIVITY:  Motion Sensitivity Quotient Intensity: 0 = none, 1 = Lightheaded, 2 = Mild, 3 = Moderate, 4 = Severe, 5 = Vomiting  Intensity  1. Sitting to supine   2. Supine to L side   3. Supine to R side   4. Supine to sitting   5. L Hallpike-Dix   6. Up from L    7. R Hallpike-Dix   8. Up from R    9. Sitting, head tipped to L knee   10. Head up from L knee   11. Sitting, head tipped to R knee   12. Head up from R knee   13. Sitting head turns x5   14.Sitting head nods x5   15. In stance, 180 turn to L    16. In stance, 180 turn to R     OTHOSTATICS: not done  FUNCTIONAL GAIT: Functional gait assessment: 28/30  [facility] PT Assessment - 08/18/23 0001       Functional Gait  Assessment   Gait assessed  Yes    Gait Level Surface Walks 20 ft in less than 5.5 sec, no assistive devices, good speed, no evidence for imbalance, normal gait pattern, deviates no more than 6 in outside of the 12 in walkway width.  Change in Gait Speed Able to smoothly change walking speed without loss of balance or gait deviation. Deviate no more than 6 in outside of the 12 in walkway width.    Gait with Horizontal Head Turns Performs head turns smoothly with no change in gait. Deviates no more than 6 in outside 12 in walkway width    Gait with Vertical Head Turns Performs head turns with no change in gait. Deviates no more than 6 in outside 12 in walkway width.    Gait and Pivot Turn Pivot turns safely within 3 sec and stops quickly with no loss of balance.    Step Over Obstacle Is able to step over 2 stacked shoe boxes taped together (9 in total height) without changing gait speed. No evidence of imbalance.    Gait with Narrow Base of Support Ambulates 7-9 steps.    Gait with Eyes Closed Walks 20 ft, uses assistive device, slower speed, mild gait deviations, deviates  6-10 in outside 12 in walkway width. Ambulates 20 ft in less than 9 sec but greater than 7 sec.    Ambulating Backwards Walks 20 ft, no assistive devices, good speed, no evidence for imbalance, normal gait    Steps Alternating feet, no rail.    Total Score 28             M-CTSIB  Condition 1: Firm Surface, EO 30 Sec, Normal Sway  Condition 2: Firm Surface, EC 30 Sec, Normal Sway  Condition 3: Foam Surface, EO 30 Sec, Normal Sway  Condition 4: Foam Surface, EC 30 Sec, Mild Sway                                                                                                                                PATIENT EDUCATION: Education details: assessment details Person educated: Patient and Spouse Education method: Explanation Education comprehension: verbalized understanding  HOME EXERCISE PROGRAM:  GOALS: Goals reviewed with patient? No    ASSESSMENT:  CLINICAL IMPRESSION: Patient is a 68 y.o. lady who was seen today for physical therapy evaluation and treatment for dizziness/unsteadiness. Exam largely unrevealing except notable for saccadic intrusions with limited gaze stability/fixation and seemingly some instances with gaze-evoked nystagmus being present, but again difficult to tell from visual exam as amplitude was small. Head Impulse test negative bilaterally.  No provocation to symptoms with positional testing and balance testing reveals low risk for falls and good postural stability under demands of Functional Gait Assessment and M-CTSIB (normal Romberg) and no obvious deficits under coordination testing.  Given the lack of clear deficits, limitations, and symptoms I do not think physical therapy would be helpful for her.    OBJECTIVE IMPAIRMENTS: dizziness.   ACTIVITY LIMITATIONS: locomotion level  PARTICIPATION LIMITATIONS: community activity  PERSONAL FACTORS: Time since onset of injury/illness/exacerbation are also affecting patient's functional outcome.   REHAB  POTENTIAL: N/A  CLINICAL DECISION MAKING: Evolving/moderate complexity  EVALUATION COMPLEXITY: Moderate   PLAN:  PT FREQUENCY: one time visit  PT DURATION:   PLANNED INTERVENTIONS: 97164- PT Re-evaluation, 97110-Therapeutic exercises, 97530- Therapeutic activity, [id number]- Neuromuscular re-education, 97535- Self Care, and 01601- Manual therapy  PLAN FOR NEXT SESSION: defer to medical management for further assessment at this time.    1:12 PM, 08/18/23 M. Shary Decamp, PT, DPT Physical Therapist- [place] Office Number: [phone]

## 2023-08-24 ENCOUNTER — Telehealth: Payer: Self-pay | Admitting: Gastroenterology

## 2023-08-24 ENCOUNTER — Encounter: Payer: Self-pay | Admitting: Neurology

## 2023-08-24 NOTE — Telephone Encounter (Signed)
 Her hospital follow up appointment is 10/04/23 with JZ. Stay on sucralfate until then?

## 2023-08-24 NOTE — Telephone Encounter (Signed)
 Inbound call from patient stating she has prescription for sucralfate but she is unsure how long she should take it. Patient requesting a call back. Please advise, thank you.

## 2023-08-26 ENCOUNTER — Other Ambulatory Visit: Payer: Self-pay

## 2023-08-26 MED ORDER — SUCRALFATE 1 GM/10ML PO SUSP: 1.0000 g | Freq: Three times a day (TID) | ORAL | 1 refills | Status: DC

## 2023-08-26 NOTE — Telephone Encounter (Signed)
 Please advise her to use sucralfate continuously for 2 weeks before meals and at bedtime and after that she can use it as needed based on symptoms of heartburn, discomfort or regurgitation before meals and at bedtime.  Please send a referral thank you

## 2023-08-26 NOTE — Telephone Encounter (Signed)
 Called the patient. No answer. Left the information on her voicemail. Refilled the prescription . Offered to the patient to increase the amount dispensed if she wanted a full 30 day supply.

## 2023-09-21 ENCOUNTER — Ambulatory Visit (INDEPENDENT_AMBULATORY_CARE_PROVIDER_SITE_OTHER): Admitting: Neurology

## 2023-09-21 ENCOUNTER — Other Ambulatory Visit

## 2023-09-21 ENCOUNTER — Encounter: Payer: Self-pay | Admitting: Neurology

## 2023-09-21 VITALS — BP 135/79 | HR 116 | Ht 64.0 in | Wt 120.6 lb

## 2023-09-21 DIAGNOSIS — G621 Alcoholic polyneuropathy: Secondary | ICD-10-CM

## 2023-09-21 DIAGNOSIS — H9313 Tinnitus, bilateral: Secondary | ICD-10-CM | POA: Diagnosis not present

## 2023-09-21 DIAGNOSIS — R202 Paresthesia of skin: Secondary | ICD-10-CM | POA: Diagnosis not present

## 2023-09-21 DIAGNOSIS — H93A3 Pulsatile tinnitus, bilateral: Secondary | ICD-10-CM

## 2023-09-21 DIAGNOSIS — R2681 Unsteadiness on feet: Secondary | ICD-10-CM

## 2023-09-21 NOTE — Patient Instructions (Signed)
 Check labs Nerve testing of both legs MRI and MRA head Reduce alcohol consumption  ELECTROMYOGRAM AND NERVE CONDUCTION STUDIES (EMG/NCS) INSTRUCTIONS  How to Prepare The neurologist conducting the EMG will need to know if you have certain medical conditions. Tell the neurologist and other EMG lab personnel if you: Have a pacemaker or any other electrical medical device Take blood-thinning medications Have hemophilia, a blood-clotting disorder that causes prolonged bleeding Bathing Take a shower or bath shortly before your exam in order to remove oils from your skin. Don't apply lotions or creams before the exam.  What to Expect You'll likely be asked to change into a hospital gown for the procedure and lie down on an examination table. The following explanations can help you understand what will happen during the exam.  Electrodes. The neurologist or a technician places surface electrodes at various locations on your skin depending on where you're experiencing symptoms. Or the neurologist may insert needle electrodes at different sites depending on your symptoms.  Sensations. The electrodes will at times transmit a tiny electrical current that you may feel as a twinge or spasm. The needle electrode may cause discomfort or pain that usually ends shortly after the needle is removed. If you are concerned about discomfort or pain, you may want to talk to the neurologist about taking a short break during the exam.  Instructions. During the needle EMG, the neurologist will assess whether there is any spontaneous electrical activity when the muscle is at rest - activity that isn't present in healthy muscle tissue - and the degree of activity when you slightly contract the muscle.  He or she will give you instructions on resting and contracting a muscle at appropriate times. Depending on what muscles and nerves the neurologist is examining, he or she may ask you to change positions during the exam.   After your EMG You may experience some temporary, minor bruising where the needle electrode was inserted into your muscle. This bruising should fade within several days. If it persists, contact your primary care doctor.

## 2023-09-21 NOTE — Progress Notes (Signed)
 [facility] Clinic Note - Initial Visit   Date: 09/21/2023   Ana Graves MRN: [id number] DOB: 27-Aug-1955   Dear Dr. Aloha Graves:  Thank you for your kind referral of Ana Graves for consultation of gait imbalance and falls. Although her history is well known to you, please allow us  to reiterate it for the purpose of our medical record. The patient was accompanied to the clinic by husband who also provides collateral information.     Ana Graves is a 68 y.o. ambidextrous-handed female with RA on Humira, depression, and recent GI bleed presenting for evaluation of gait imbalance.   IMPRESSION/PLAN: Gait instability, possible due to peripheral neuropathy.  Risk factors - alcohol. However, I would not expect sudden onset of symptoms following dental procedure.   - Check vitamin B12, folate, vitamin B1  - NCS/EMG bilateral legs  2.  Tinnitus and falls  - MRI brain wo contrast to evaluate for structural pathology  - MRA head   Further recommendations pending results.   ------------------------------------------------------------- History of present illness: In February 2025, she had three crowns placed in her mouth.  Following this, she had high pitched ringing in the ears and reports having instability.  She has fallen 5 times, the last which caused laceration to the forehead, requiring stitches.  She does not have any warning signs prior to her fall.  She denies numbness/tingling, leg weakness, or low back pain.  ENT and dental evaluation is unrevealing.  She is supposed to have hearing test performed.   Nomsmoker.  She drinks alcohol daily.  She does not want to disclose amount of alcohol consumed.  Out-side paper records, electronic medical record, and images have been reviewed where available and summarized as:  CT head 08/03/2023: No evidence of intracranial injury.  Negative for facial fracture.    Past Medical History:   Diagnosis Date   Anxiety    Arthritis    Rhuematoid arthritis   Blood transfusion without reported diagnosis 2007   Cancer [facility]) 2012   skin cancer; face   Complication of anesthesia    airway obstruction during endo procedure   Elevated LFTs    GERD (gastroesophageal reflux disease)    Hemorrhoids    History of colon polyps    Hypoglycemia    Rosacea     Past Surgical History:  Procedure Laterality Date   BIOPSY  04/21/2022   Procedure: BIOPSY;  Surgeon: Ana Dandy, MD;  Location: [facility];  Service: Gastroenterology;;   ESOPHAGOGASTRODUODENOSCOPY N/A 04/21/2022   Procedure: ESOPHAGOGASTRODUODENOSCOPY (EGD);  Surgeon: Nandigam, Kavitha V, MD;  Location: [facility];  Service: Gastroenterology;  Laterality: N/A;   ESOPHAGOGASTRODUODENOSCOPY N/A 08/12/2023   Procedure: EGD (ESOPHAGOGASTRODUODENOSCOPY);  Surgeon: Ana Furrow, MD;  Location: [facility];  Service: Gastroenterology;  Laterality: N/A;   KNEE ARTHROSCOPY Left 1994   MOHS SURGERY  2012   OTHER SURGICAL HISTORY  1996   surgery for skin cancer   TUBAL LIGATION  1995   UTERINE FIBROID SURGERY  2004     Medications:  Outpatient Encounter Medications as of 09/21/2023  Medication Sig   calcium carbonate (TUMS - DOSED IN MG ELEMENTAL CALCIUM) 500 MG chewable tablet Chew 1 tablet by mouth daily as needed for indigestion or heartburn.   folic acid  (FOLVITE ) 1 MG tablet Take 1 tablet (1 mg total) by mouth daily.   HUMIRA PEN 40 MG/0.4ML PNKT Inject 40 mg into the skin every 14 (fourteen) days.   Multiple Vitamin (MULTIVITAMIN) tablet Take 1  tablet by mouth 3 (three) times a week.   pantoprazole  (PROTONIX ) 40 MG tablet Take 1 tablet (40 mg total) by mouth 2 (two) times daily.   sertraline (ZOLOFT) 50 MG tablet Take 50 mg by mouth daily.   thiamine  (VITAMIN B-1) 100 MG tablet Take 1 tablet (100 mg total) by mouth daily.   [DISCONTINUED] sucralfate  (CARAFATE ) 1 GM/10ML suspension Take 10 mLs (1 g total) by  mouth 4 (four) times daily -  with meals and at bedtime. (Patient not taking: Reported on 09/21/2023)   No facility-administered encounter medications on file as of 09/21/2023.    Allergies:  Allergies  Allergen Reactions   Fluorouracil Other (See Comments)   Doxycycline Nausea And Vomiting   Hydroxychloroquine Nausea And Vomiting   Other Nausea And Vomiting    ANY Antibiotics (quins and cillins)    Family History: Family History  Problem Relation Age of Onset   Stomach cancer Mother    Diabetes Father    Parkinson's disease Father    Rectal cancer Sister    Colon cancer Neg Hx    Esophageal cancer Neg Hx     Social History: Social History   Tobacco Use   Smoking status: Never   Smokeless tobacco: Never  Vaping Use   Vaping status: Never Used  Substance Use Topics   Alcohol use: Yes    Alcohol/week: 21.0 standard drinks of alcohol    Types: 21 Glasses of wine per week    Comment: pt states she stopped drinking 07/21/2022   09-21-23- Does not tell anyone   Drug use: No   Social History   Social History Narrative   Are you right handed or left handed? Both    Are you currently employed ? No    What is your current occupation? Retired   Do you live at home alone? No   Who lives with you? Husband- Ana Graves and has two big dogs    What type of home do you live in: 1 story or 2 story? Lives in a two story home with the main living area on the first floor. Patient is very careful on stairs.         Vital Signs:  BP 135/79   Pulse (!) 116   Ht 5\' 4"  (1.626 m)   Wt 120 lb 9.6 oz (54.7 kg)   SpO2 97%   BMI 20.70 kg/m   Neurological Exam: MENTAL STATUS including orientation to time, place, person, recent and remote memory, attention span and concentration, language, and fund of knowledge is normal.  Speech is not dysarthric.  CRANIAL NERVES: II:  No visual field defects.     III-IV-VI: Pupils equal round and reactive to light.  Normal conjugate, extra-ocular eye  movements in all directions of gaze.  No nystagmus.  No ptosis.   Graves:  Normal facial sensation.    VII:  Normal facial symmetry and movements.   VIII:  Normal hearing and vestibular function.   IX-X:  Normal palatal movement.   XI:  Normal shoulder shrug and head rotation.   XII:  Normal tongue strength and range of motion, no deviation or fasciculation.  MOTOR:  No atrophy, fasciculations or abnormal movements.  No pronator drift.   Upper Extremity:  Right  Left  Deltoid  5/5   5/5   Biceps  5/5   5/5   Triceps  5/5   5/5   Wrist extensors  5/5   5/5   Wrist flexors  5/5   5/5   Finger extensors  5/5   5/5   Finger flexors  5/5   5/5   Dorsal interossei  5/5   5/5   Abductor pollicis  5/5   5/5   Tone (Ashworth scale)  0  0   Lower Extremity:  Right  Left  Hip flexors  5/5   5/5   Knee flexors  5/5   5/5   Knee extensors  5/5   5/5   Dorsiflexors  5/5   5/5   Plantarflexors  5/5   5/5   Toe extensors  5/5   5/5   Toe flexors  5/5   5/5   Tone (Ashworth scale)  0  0   MSRs:                                           Right        Left brachioradialis 2+  2+  biceps 2+  2+  triceps 2+  2+  patellar 2+  2+  ankle jerk 0  0  Hoffman no  no  plantar response down  down   SENSORY:  Diminished vibration and pin prick below the ankles bilaterally.  Rhomberg testing is positive.   COORDINATION/GAIT: Mild dysmetria with finger to nose testing on the right.  Intact rapid alternating movements bilaterally.  Gait mildly wide based and stable. She is unable to perform tandem gait.      Thank you for allowing me to participate in patient's care.  If I can answer any additional questions, I would be pleased to do so.    Sincerely,    Trapper Meech K. Lydia Sams, DO

## 2023-09-22 ENCOUNTER — Telehealth: Payer: Self-pay | Admitting: Neurology

## 2023-09-22 NOTE — Telephone Encounter (Signed)
 Patient called and said she had an appt with Dr.Patel yesterday. She doesn't like how she put alcoholic in her information. She wants it changed to alcohol related not an alcohol . She doesn't want this in her permanent record.

## 2023-09-22 NOTE — Telephone Encounter (Signed)
 Called patient and left a message for a call back.

## 2023-09-22 NOTE — Telephone Encounter (Signed)
 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  Diagnosis are based on ICD10 codes and there is no diagnosis code for "alcohol related neuropathy".

## 2023-09-23 NOTE — Telephone Encounter (Signed)
 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  Diagnosis are based on ICD10 codes and there is no diagnosis code for "alcohol related neuropathy".   Patient verbalized understanding and had no further questions or concerns.

## 2023-09-25 LAB — B12 AND FOLATE PANEL
Folate: 18.6 ng/mL
Vitamin B-12: 439 pg/mL (ref 200–1100)

## 2023-09-25 LAB — VITAMIN B1: Vitamin B1 (Thiamine): 12 nmol/L (ref 8–30)

## 2023-10-11 ENCOUNTER — Ambulatory Visit: Admitting: Gastroenterology

## 2023-10-19 ENCOUNTER — Other Ambulatory Visit: Payer: Self-pay | Admitting: Orthopedic Surgery

## 2023-10-20 LAB — SURGICAL PATHOLOGY

## 2023-10-26 ENCOUNTER — Ambulatory Visit
Admission: RE | Admit: 2023-10-26 | Discharge: 2023-10-26 | Disposition: A | Discharge status: Home / Self Care | Source: Ambulatory Visit | Attending: Neurology | Admitting: Neurology

## 2023-10-26 DIAGNOSIS — R202 Paresthesia of skin: Secondary | ICD-10-CM

## 2023-10-26 DIAGNOSIS — H93A3 Pulsatile tinnitus, bilateral: Secondary | ICD-10-CM

## 2023-10-26 DIAGNOSIS — G621 Alcoholic polyneuropathy: Secondary | ICD-10-CM

## 2023-10-26 DIAGNOSIS — H9313 Tinnitus, bilateral: Secondary | ICD-10-CM

## 2023-10-26 DIAGNOSIS — R2681 Unsteadiness on feet: Secondary | ICD-10-CM

## 2023-10-29 ENCOUNTER — Ambulatory Visit (INDEPENDENT_AMBULATORY_CARE_PROVIDER_SITE_OTHER): Admitting: Neurology

## 2023-10-29 DIAGNOSIS — R202 Paresthesia of skin: Secondary | ICD-10-CM

## 2023-10-29 DIAGNOSIS — H9313 Tinnitus, bilateral: Secondary | ICD-10-CM

## 2023-10-29 NOTE — Procedures (Signed)
 [facility]  [address] [place], [address]  [place], [place] [address] Tel: [phone] Fax: [phone] Test Date:  10/29/2023  Patient: Ana Graves DOB: [birth year] Physician: Reyna Cava, DO  Sex: Female Height: 5\' 4"  Ref Phys: Reyna Cava, DO  ID#: [id number]   Technician:    History: This is a 68 year old female referred for evaluation of gait unsteadiness and bilateral feet tingling.  NCV & EMG Findings: Electrodiagnostic testing of the right lower extremity and additional studies of the left shows: Bilateral sural and superficial peroneal sensory responses are within normal limits. Bilateral peroneal and tibial motor responses are within normal limits. Bilateral tibial H reflex studies are within normal limits. There is no evidence of active or chronic motor axonal changes affecting any of the tested muscles.  Motor unit configuration and recruitment pattern is within normal limits.   Impression: This is a normal study of the lower extremities.  In particular, there is no evidence of a large fiber sensorimotor polyneuropathy or lumbosacral radiculopathy.      ___________________________ Reyna Cava, DO    Nerve Conduction Studies   Stim Site NR Peak (ms) Norm Peak (ms) O-P Amp (V) Norm O-P Amp  Left Sup Peroneal Anti Sensory (Ant Lat Mall)  32 C  12 cm    2.6 <4.6 8.1 >3  Right Sup Peroneal Anti Sensory (Ant Lat Mall)  32 C  12 cm    2.2 <4.6 8.7 >3  Left Sural Anti Sensory (Lat Mall)  32 C  Calf    3.0 <4.6 7.3 >3  Right Sural Anti Sensory (Lat Mall)  32 C  Calf    2.7 <4.6 9.7 >3     Stim Site NR Onset (ms) Norm Onset (ms) O-P Amp (mV) Norm O-P Amp Site1 Site2 Delta-0 (ms) Dist (cm) Vel (m/s) Norm Vel (m/s)  Left Peroneal Motor (Ext Dig Brev)  32 C  Ankle    5.5 <6.0 3.1 >2.5 B Fib Ankle 8.5 34.0 40 >40  B Fib    14.0  3.1  Poplt B Fib 1.9 8.0 42 >40  Poplt    15.9  3.1         Right Peroneal Motor (Ext Dig Brev)  32 C  Ankle     4.4 <6.0 4.9 >2.5 B Fib Ankle 8.5 37.0 44 >40  B Fib    12.9  4.8  Poplt B Fib 1.7 8.0 47 >40  Poplt    14.6  4.6         Left Tibial Motor (Abd Hall Brev)  32 C  Ankle    4.7 <6.0 12.1 >4 Knee Ankle 8.9 44.0 49 >40  Knee    13.6  8.0         Right Tibial Motor (Abd Hall Brev)  32 C  Ankle    5.5 <6.0 11.6 >4 Knee Ankle 7.4 44.0 59 >40  Knee    12.9  6.5          Electromyography   Side Muscle Ins.Act Fibs Fasc Recrt Amp Dur Poly Activation Comment  Right AntTibialis Nml Nml Nml Nml Nml Nml Nml Nml N/A  Right Gastroc Nml Nml Nml Nml Nml Nml Nml Nml N/A  Right Flex Dig Long Nml Nml Nml Nml Nml Nml Nml Nml N/A  Right RectFemoris Nml Nml Nml Nml Nml Nml Nml Nml N/A  Right BicepsFemS Nml Nml Nml Nml Nml Nml Nml Nml N/A  Right GluteusMed Nml Nml Nml Nml Nml Nml  Nml Nml N/A  Left AntTibialis Nml Nml Nml Nml Nml Nml Nml Nml N/A  Left Gastroc Nml Nml Nml Nml Nml Nml Nml Nml N/A  Left Flex Dig Long Nml Nml Nml Nml Nml Nml Nml Nml N/A  Left RectFemoris Nml Nml Nml Nml Nml Nml Nml Nml N/A  Left GluteusMed Nml Nml Nml Nml Nml Nml Nml Nml N/A     Waveforms:

## 2023-11-01 ENCOUNTER — Ambulatory Visit: Payer: Self-pay | Admitting: Neurology

## 2023-11-24 NOTE — Telephone Encounter (Signed)
Pt is returning a call to [facility]

## 2024-01-05 ENCOUNTER — Telehealth: Payer: Self-pay | Admitting: Neurology

## 2024-01-05 NOTE — Telephone Encounter (Signed)
 Pt called this afternoon and she wants to know if she still have to attend the Follow up appointment on 01-17-24 at 10:30 am if she received a call from nurse stating that her MRI and nerve test was fine. Thanks

## 2024-01-06 NOTE — Telephone Encounter (Signed)
 Ok to cancel visit, if she is not having any new symptoms.

## 2024-01-17 ENCOUNTER — Ambulatory Visit: Admitting: Neurology

## 2024-03-27 ENCOUNTER — Emergency Department (HOSPITAL_COMMUNITY)
Admission: EM | Admit: 2024-03-27 | Discharge: 2024-03-27 | Disposition: A | Discharge status: Home / Self Care | Attending: Emergency Medicine | Admitting: Emergency Medicine

## 2024-03-27 ENCOUNTER — Other Ambulatory Visit: Payer: Self-pay

## 2024-03-27 ENCOUNTER — Encounter (HOSPITAL_COMMUNITY): Payer: Self-pay

## 2024-03-27 DIAGNOSIS — R112 Nausea with vomiting, unspecified: Secondary | ICD-10-CM | POA: Diagnosis present

## 2024-03-27 DIAGNOSIS — R197 Diarrhea, unspecified: Secondary | ICD-10-CM | POA: Diagnosis not present

## 2024-03-27 DIAGNOSIS — R1084 Generalized abdominal pain: Secondary | ICD-10-CM | POA: Diagnosis not present

## 2024-03-27 LAB — URINALYSIS, ROUTINE W REFLEX MICROSCOPIC
Bilirubin Urine: NEGATIVE
Glucose, UA: NEGATIVE mg/dL
Hgb urine dipstick: NEGATIVE
Ketones, ur: NEGATIVE mg/dL
Leukocytes,Ua: NEGATIVE
Nitrite: NEGATIVE
Protein, ur: NEGATIVE mg/dL
Specific Gravity, Urine: 1.011 (ref 1.005–1.030)
pH: 7 (ref 5.0–8.0)

## 2024-03-27 LAB — CBC
HCT: 39.7 % (ref 36.0–46.0)
Hemoglobin: 13.4 g/dL (ref 12.0–15.0)
MCH: 33.5 pg (ref 26.0–34.0)
MCHC: 33.8 g/dL (ref 30.0–36.0)
MCV: 99.3 fL (ref 80.0–100.0)
Platelets: 179 K/uL (ref 150–400)
RBC: 4 MIL/uL (ref 3.87–5.11)
RDW: 11.9 % (ref 11.5–15.5)
WBC: 7.7 K/uL (ref 4.0–10.5)
nRBC: 0 % (ref 0.0–0.2)

## 2024-03-27 LAB — COMPREHENSIVE METABOLIC PANEL WITH GFR
ALT: 52 U/L — ABNORMAL HIGH (ref 0–44)
AST: 125 U/L — ABNORMAL HIGH (ref 15–41)
Albumin: 4.5 g/dL (ref 3.5–5.0)
Alkaline Phosphatase: 90 U/L (ref 38–126)
Anion gap: 14 (ref 5–15)
BUN: 7 mg/dL — ABNORMAL LOW (ref 8–23)
CO2: 24 mmol/L (ref 22–32)
Calcium: 10.2 mg/dL (ref 8.9–10.3)
Chloride: 93 mmol/L — ABNORMAL LOW (ref 98–111)
Creatinine, Ser: 0.52 mg/dL (ref 0.44–1.00)
GFR, Estimated: >60 mL/min (ref >60)
Glucose, Bld: 119 mg/dL — ABNORMAL HIGH (ref 70–99)
Potassium: 4.1 mmol/L (ref 3.5–5.1)
Sodium: 131 mmol/L — ABNORMAL LOW (ref 135–145)
Total Bilirubin: 1.2 mg/dL (ref 0.0–1.2)
Total Protein: 8.8 g/dL — ABNORMAL HIGH (ref 6.5–8.1)

## 2024-03-27 LAB — MAGNESIUM: Magnesium: 1.5 mg/dL — ABNORMAL LOW (ref 1.7–2.4)

## 2024-03-27 LAB — CBG MONITORING, ED: Glucose-Capillary: 128 mg/dL — ABNORMAL HIGH (ref 70–99)

## 2024-03-27 LAB — LIPASE, BLOOD: Lipase: 115 U/L — ABNORMAL HIGH (ref 11–51)

## 2024-03-27 MED ORDER — MAGNESIUM SULFATE 50 % IJ SOLN: 2.0000 g | Freq: Once | INTRAMUSCULAR | Status: DC

## 2024-03-27 MED ORDER — ONDANSETRON 4 MG PO TBDP: 4.0000 mg | ORAL_TABLET | Freq: Three times a day (TID) | ORAL | 0 refills | Status: DC | PRN

## 2024-03-27 MED ORDER — LACTATED RINGERS IV BOLUS
1000.0000 mL | Freq: Once | INTRAVENOUS | Status: AC
  Administered 2024-03-27: 1000 mL via INTRAVENOUS

## 2024-03-27 MED ORDER — SUCRALFATE 1 G PO TABS
1.0000 g | ORAL_TABLET | Freq: Once | ORAL | Status: AC
  Administered 2024-03-27: 1 g via ORAL
  Filled 2024-03-27: qty 1

## 2024-03-27 MED ORDER — MAGNESIUM SULFATE 2 GM/50ML IV SOLN
2.0000 g | Freq: Once | INTRAVENOUS | Status: AC
Start: 2024-03-27 — End: 2024-03-27
  Administered 2024-03-27: 2 g via INTRAVENOUS
  Filled 2024-03-27: qty 50

## 2024-03-27 MED ORDER — ONDANSETRON 8 MG PO TBDP: 8.0000 mg | ORAL_TABLET | Freq: Once | ORAL | Status: DC

## 2024-03-27 MED ORDER — ONDANSETRON 8 MG PO TBDP
8.0000 mg | ORAL_TABLET | Freq: Once | ORAL | Status: AC
  Administered 2024-03-27: 8 mg via ORAL
  Filled 2024-03-27: qty 1

## 2024-03-27 MED ORDER — HYDROMORPHONE HCL 1 MG/ML IJ SOLN
1.0000 mg | Freq: Once | INTRAMUSCULAR | Status: AC
  Administered 2024-03-27: 1 mg via INTRAVENOUS
  Filled 2024-03-27: qty 1

## 2024-03-27 NOTE — ED Provider Notes (Signed)
 [place] EMERGENCY DEPARTMENT AT [facility] Provider Note   CSN: [id number] Arrival date & time: 03/27/24  9093     Patient presents with: Abdominal Pain, Nausea, Vomiting, and Weakness   Ana Graves is a 68 y.o. female with a history of grade C esophagitis and gastritis requiring hospitalization 7 months ago, presents to the ED with weakness, abdominal pain, nausea, vomiting, diarrhea. The symptoms started 1 month ago and have been progressively worsening since onset. The patient states that this week she has not been able to keep any food down,and states that shortly after eating she vomits and/or has diarrhea. The patient reports no blood in emesis, blood in stool, urinary symptoms, neurological changes, fevers.  Patient states that this is how she felt when she was hospitalized 7 months ago however she is not vomiting blood or passing blood in stool. No recent travel. No sick contacts. The patient's social history is notable for alcohol use, unknown amount as patient chooses not to relay.     Abdominal Pain Weakness Associated symptoms: abdominal pain        Prior to Admission medications   Medication Sig Start Date End Date Taking? Authorizing Provider  ondansetron  (ZOFRAN -ODT) 4 MG disintegrating tablet Take 1 tablet (4 mg total) by mouth every 8 (eight) hours as needed for nausea or vomiting. 03/27/24  Yes Paizlee Kinder L, PA  calcium carbonate (TUMS - DOSED IN MG ELEMENTAL CALCIUM) 500 MG chewable tablet Chew 1 tablet by mouth daily as needed for indigestion or heartburn.    [provider]  folic acid  (FOLVITE ) 1 MG tablet Take 1 tablet (1 mg total) by mouth daily. 08/14/23   Regalado, Belkys A, MD  HUMIRA PEN 40 MG/0.4ML PNKT Inject 40 mg into the skin every 14 (fourteen) days. 02/02/22   [provider]  Multiple Vitamin (MULTIVITAMIN) tablet Take 1 tablet by mouth 3 (three) times a week.    [provider]  pantoprazole   (PROTONIX ) 40 MG tablet Take 1 tablet (40 mg total) by mouth 2 (two) times daily. 08/13/23   Regalado, Belkys A, MD  sertraline (ZOLOFT) 50 MG tablet Take 50 mg by mouth daily.    [provider]  thiamine  (VITAMIN B-1) 100 MG tablet Take 1 tablet (100 mg total) by mouth daily. 08/14/23   Regalado, Owen A, MD    Allergies: Fluorouracil, Doxycycline, Hydroxychloroquine, and Other    Review of Systems  Gastrointestinal:  Positive for abdominal pain.  Neurological:  Positive for weakness.    Updated Vital Signs BP (!) 160/87   Pulse 61   Temp 97.6 F (36.4 C) (Oral)   Resp 14   SpO2 99%   Physical Exam Vitals and nursing note reviewed.  Constitutional:      General: She is not in acute distress.    Appearance: Normal appearance. She is ill-appearing.  HENT:     Head: Normocephalic and atraumatic.  Eyes:     Extraocular Movements: Extraocular movements intact.     Conjunctiva/sclera: Conjunctivae normal.     Pupils: Pupils are equal, round, and reactive to light.  Cardiovascular:     Rate and Rhythm: Normal rate and regular rhythm.     Pulses: Normal pulses.          Radial pulses are 2+ on the right side.  Pulmonary:     Effort: Pulmonary effort is normal. No respiratory distress.     Breath sounds: Normal breath sounds.     Comments: Patient had  no difficulty speaking in complete sentences. Abdominal:     General: Abdomen is flat.     Palpations: Abdomen is soft.     Tenderness: There is abdominal tenderness.     Comments: Patient complained of generalized tenderness to palpation to entire abdomen.  No distention, guarding, or rebound tenderness.  No CVA tenderness.  Musculoskeletal:        General: Normal range of motion.     Cervical back: Normal range of motion.     Right lower leg: No edema.     Left lower leg: No edema.  Skin:    General: Skin is warm and dry.     Capillary Refill: Capillary refill takes less than 2 seconds.  Neurological:     General:  No focal deficit present.     Mental Status: She is alert. Mental status is at baseline.  Psychiatric:        Mood and Affect: Mood normal.     (all labs ordered are listed, but only abnormal results are displayed) Labs Reviewed  LIPASE, BLOOD - Abnormal; Notable for the following components:      Result Value   Lipase 115 (*)    All other components within normal limits  COMPREHENSIVE METABOLIC PANEL WITH GFR - Abnormal; Notable for the following components:   Sodium 131 (*)    Chloride 93 (*)    Glucose, Bld 119 (*)    BUN 7 (*)    Total Protein 8.8 (*)    AST 125 (*)    ALT 52 (*)    All other components within normal limits  MAGNESIUM  - Abnormal; Notable for the following components:   Magnesium  1.5 (*)    All other components within normal limits  CBG MONITORING, ED - Abnormal; Notable for the following components:   Glucose-Capillary 128 (*)    All other components within normal limits  CBC  URINALYSIS, ROUTINE W REFLEX MICROSCOPIC  CBG MONITORING, ED    EKG: EKG Interpretation Date/Time:  Monday March 27 2024 10:17:01 EST Ventricular Rate:  76 PR Interval:  140 QRS Duration:  88 QT Interval:  392 QTC Calculation: 441 R Axis:   44  Text Interpretation: Sinus rhythm No acute changes No significant change since last tracing Confirmed by Charlyn Sora [phone]) on 03/27/2024 1:54:25 PM  Radiology: No results found.   Procedures   Medications Ordered in the ED  ondansetron  (ZOFRAN -ODT) disintegrating tablet 8 mg (8 mg Oral Given 03/27/24 1021)  HYDROmorphone (DILAUDID) injection 1 mg (1 mg Intravenous Given 03/27/24 1025)  lactated ringers  bolus 1,000 mL (0 mLs Intravenous Stopped 03/27/24 1128)  sucralfate  (CARAFATE ) tablet 1 g (1 g Oral Given 03/27/24 1126)  magnesium  sulfate IVPB 2 g 50 mL (0 g Intravenous Stopped 03/27/24 1449)                                    Medical Decision Making Amount and/or Complexity of Data Reviewed Labs:  ordered.   Patient presents to the ED for concern of multiple symptoms including abdominal pain with N/V/D and weakness x 1 month, this involves an extensive number of treatment options, and is a complaint that carries with it a high risk of complications and morbidity.    The differential diagnosis includes: Infectious etiology Obstruction Hepatobiliary etiology  Co morbidities that complicate the patient evaluation: Grade C esophagitis Increased alcohol use  Additional history obtained: Additional history  obtained from  Outside Medical Records and Past Admission  External records from outside source obtained and reviewed including medical history, surgical history, medications, allergies.  Patient's admission for GI bleeding due to esophagitis back in March 2025 was reviewed during this visit. The patient and her husband were a reliable historians, providing a clear, detailed, and consistent account of the presenting symptoms and relevant medical history. The information was obtained directly from the patient and her husband and statements were documented in the patient's own words when possible. No discrepancies were noted between the history provided and available collateral sources.     Lab Tests: I ordered, and personally interpreted labs.   The pertinent results include:  Lipase - 115 CMP - elevated LFTs CBC - WNL Magnesium  - 1.5 POC CBG - 128 Urinalysis - Negative   Cardiac Monitoring: The patient was maintained on a cardiac monitor.  I personally viewed and interpreted the cardiac monitored which showed an underlying rhythm of: Sinus rhythm  Medicines ordered and prescription drug management: I ordered medications: Lactated ringer  bolus 1000 mL for rehydration Magnesium  sulfate 2 g 50 mL for hypomagnesia Zofran  8 mg for nausea Carafate  tablet 1 g Dilaudid 1 mg for pain management Reevaluation of the patient after these medicines showed that the patient improved I  have reviewed the patients home medicines and have made adjustments as needed  Test Considered: Diagnostic testing was considered based on the patient's presenting symptoms, risk factors, and initial clinical assessment.  CT abdomen was considered during this visit, however given patient's history of esophagitis, ongoing symptoms x 1 month, no blood reported in emesis or stool, generalized abdominal pain on exam, no fevers, and laboratory studies not indicating further investigation - CT abdomen was deemed not necessary at this time. CT imaging consideration discussed with Dr. Carlota. The approach to diagnostic testing prioritized exclusion of life-threatening conditions  Problem List / ED Course: Problem List: N/V/D Weakness  Emergency Department Course: The patient presented with nausea, vomiting, diarrhea with associated weakness x 1 month - with symptoms increasing in intensity in the last week. Initial assessment included history, physical exam, and review of prior medical records. ECG was ordered and reviewed; negative for acute ischemia.  CMP, lipase, CBC were ordered to rule out possible infectious or metabolic etiology - LFTs were notably increased, lipase was elevated.  Magnesium  was ordered and reviewed - patient found to have hypomagnesia.  Urinalysis was ordered to rule out other possible causes of N/V/D - unremarkable.  Patient was given a lactated ringer  fluid bolus for rehydration and pain was managed during this visit.  Patient was given Zofran  for nausea, patient was able to tolerate oral rehydration and was able to keep down 5-6 crackers.  Patient was also given Carafate  and 2 g of magnesium  for hypomagnesia. All vitals stayed stable during visit.  Given patient's past medical history of esophagitis and increased alcohol use, history of present illness, physical exam findings, laboratory studies, and patient's response to treatment including successful oral challenge to discharge  with close follow-up with gastroenterology for further care and evaluation of possible alcohol induced gastritis/esophagitis.  Reevaluation: After the interventions noted above, I reevaluated the patient and found that they have :improved  Dispostion: After consideration of the diagnostic results and the patients response to treatment, I feel that the patent would benefit from discharge home with Zofran  and supportive care measures with close follow-up with gastroenterology for further evaluation and care. Clinical Assessment:    Working diagnosis: Alcohol-induced gastritis/esophagitis  resulting in ongoing N/V/D and weakness. Disposition Plan: The patient is medically stable for discharge from the Emergency Department at this time. Vital signs are within normal limits, and the patient is alert, oriented, and in no acute distress. Diagnostic evaluation has been completed with no findings necessitating hospital admission or further emergent workup.  Communication:   Patient and her husband informed of disposition decision and rationale. Questions addressed.  The diagnostic results and clinical impression were discussed with the patient at bedside and the patient demonstrated understanding.     Final diagnoses:  Nausea and vomiting, unspecified vomiting type  Diarrhea, unspecified type    ED Discharge Orders          Ordered    ondansetron  (ZOFRAN -ODT) 4 MG disintegrating tablet  Every 8 hours PRN        03/27/24 1430               Donnamarie Shankles L, [place] 03/28/24 0950    Charlyn Sora, MD 03/28/24 1800

## 2024-03-27 NOTE — ED Triage Notes (Signed)
 PT arrives via POV. She reports ongoing generalized weakness, abdominal pain, nausea, vomiting, and diarrhea. She states symptoms have been present intermittently for over a year but have gotten worse in the past week. She reports she drinks wine daily, but declines to elaborate on how much. She is AxOx4.

## 2024-03-27 NOTE — ED Notes (Signed)
 Patient unable to provide urine specimen at this time because she used the bathroom right before being called back to triage.

## 2024-03-27 NOTE — ED Notes (Signed)
 Pt tolerating Gatorlyte at this time

## 2024-03-27 NOTE — Discharge Instructions (Addendum)
 Thank you for visiting the Emergency Department today. If you have any questions about your medicines, please call your pharmacy or healthcare provider. At home, rest, hydrate, continue trying to eat small amounts of bland food, and follow up with GI. It is important to watch for warning signs such as worsening pain, fever, trouble breathing, chest pain, bleeding. If any of these happen, return to the Emergency Department or call 911. Thank you for trusting us  with your health.

## 2024-03-31 LAB — LAB REPORT - SCANNED: EGFR: 97

## 2024-04-05 ENCOUNTER — Encounter: Payer: Self-pay | Admitting: Gastroenterology

## 2024-04-05 ENCOUNTER — Ambulatory Visit (INDEPENDENT_AMBULATORY_CARE_PROVIDER_SITE_OTHER): Admitting: Gastroenterology

## 2024-04-05 ENCOUNTER — Other Ambulatory Visit: Payer: Self-pay | Admitting: Gastroenterology

## 2024-04-05 VITALS — BP 90/50 | HR 84 | Ht 63.25 in | Wt 121.5 lb

## 2024-04-05 DIAGNOSIS — R197 Diarrhea, unspecified: Secondary | ICD-10-CM

## 2024-04-05 DIAGNOSIS — K2100 Gastro-esophageal reflux disease with esophagitis, without bleeding: Secondary | ICD-10-CM

## 2024-04-05 DIAGNOSIS — R630 Anorexia: Secondary | ICD-10-CM

## 2024-04-05 DIAGNOSIS — R112 Nausea with vomiting, unspecified: Secondary | ICD-10-CM | POA: Diagnosis not present

## 2024-04-05 DIAGNOSIS — R634 Abnormal weight loss: Secondary | ICD-10-CM

## 2024-04-05 DIAGNOSIS — R1013 Epigastric pain: Secondary | ICD-10-CM

## 2024-04-05 DIAGNOSIS — F1090 Alcohol use, unspecified, uncomplicated: Secondary | ICD-10-CM

## 2024-04-05 DIAGNOSIS — R6881 Early satiety: Secondary | ICD-10-CM

## 2024-04-05 DIAGNOSIS — R7989 Other specified abnormal findings of blood chemistry: Secondary | ICD-10-CM

## 2024-04-05 MED ORDER — ONDANSETRON 4 MG PO TBDP: 4.0000 mg | ORAL_TABLET | Freq: Three times a day (TID) | ORAL | 1 refills | Status: AC | PRN

## 2024-04-05 MED ORDER — PANTOPRAZOLE SODIUM 40 MG PO TBEC: 40.0000 mg | DELAYED_RELEASE_TABLET | Freq: Two times a day (BID) | ORAL | 3 refills | Status: DC

## 2024-04-05 MED ORDER — SUCRALFATE 1 G PO TABS: ORAL_TABLET | ORAL | 1 refills | Status: DC

## 2024-04-05 NOTE — Progress Notes (Signed)
 Ana Graves [id number] [birth year]   Chief Complaint: Nausea and vomiting, diarrhea, abdominal pain  Referring Provider: Dayna Motto, DO Primary GI MD: Dr. Shila  HPI: Ana Graves is a 68 y.o. female with past medical history of anxiety, rheumatoid arthritis, GERD, hemorrhoids, colon polyps who presents today for a complaint of nausea and vomiting, diarrhea, abdominal pain.    Last seen in office 09/02/2022 by Dr. Nandigam for follow-up after EGD with esophageal dilation.  Noted to have severe erosive esophagitis and multiple esophageal strictures, biopsies negative for EOE and s/p dilation with improvement in dysphagia.  Asymptomatic at that appointment.  Advised to continue omeprazole  40 mg daily.  Repeat EGD was deferred for serial esophageal dilation as her symptoms had improved. Also noted to have abnormal LFTs with steatohepatitis, and plan was to follow-up CMP and CBC in 3 months.  She was hospitalized 08/11/2023 to 08/13/2023 for acute upper GI bleeding.  Presented with an episode of hematemesis and melena.  GI was consulted and she underwent EGD which showed grade C esophagitis, benign-appearing esophageal stenosis, gastritis.  Discharged on PPI and Carafate . Also noted to have mild transaminitis on that admission, hepatitis panel was nonreactive.  Recently seen in the ED 03/27/2024 for weakness, abdominal pain, nausea, vomiting, diarrhea.  Symptoms started a month ago and has been progressively worsening since onset.  States she was not able to keep any food down and that shortly after eating would have vomiting and/or diarrhea.  Was concerned because it was how she felt when she was hospitalized earlier in the year though denied vomiting blood or passing blood in stool.  Social history notable for alcohol use, patient chose not to relay how much. Labs notable for lipase 115, elevated LFTs, normal CBC, magnesium  1.5.  She was given fluids, Zofran , Carafate ,  Dilaudid, magnesium  sulfate. CT abdomen was deferred. She was advised to have close follow-up with GI for further evaluation of possible alcohol induced gastritis/esophagitis. She was not ready to stop alcohol use at that time.   Labs 03/27/2024: Normal CBC, AST 125, ALT 52, normal bilirubin and albumin, lipase 115, magnesium  1.5  Labs done with PCP: AST 142, ALT 64. Planning to hold statin and recheck in a few months.    Discussed the use of AI scribe software for clinical note transcription with the patient, who gave verbal consent to proceed.  History of Present Illness Ana Graves is a 68 year old female who presents with nausea, vomiting, abdominal pain, and diarrhea. She is accompanied by Sharolyn, her partner.  Gastrointestinal symptoms - Nausea, vomiting, abdominal pain, and diarrhea present for at least one month, particularly after eating - Vomiting occurs approximately once daily, especially after meals - Difficulty tolerating food; able to eat only small amounts such as Cheerios and soup - Decreased appetite and weight loss of approximately five pounds over the last two weeks - Diarrhea described as loose, sometimes watery, occurring nearly every time she eats - No blood in vomit or stools - Upper abdominal pain noted this morning, described as similar to muscle soreness after sit-ups - Consistent pain in lateral abdominal region since recent ER visit - Difficulty swallowing liquids, with sensation of liquids getting 'stuck' in the esophagus - Chills present, but no fever  Erosive esophagitis and esophageal symptoms - History of erosive esophagitis diagnosed earlier this year via upper endoscopy - Difficulty swallowing liquids with sensation of obstruction in the esophagus - Previously advised to take pantoprazole  (Protonix ) and sucralfate  (Carafate ), but had not been  taking them regularly due to side effects and recent hand surgery requiring antibiotics - Recently  resumed pantoprazole  (Protonix ) for the past three to four days  Gastrointestinal bleeding and pancreatic enzyme elevation - History of gastrointestinal bleeding with prior hospitalization - Recent ER visit revealed slightly elevated lipase levels - No known history of pancreatitis  Medication changes and adverse effects - Antibiotics started a few days ago for thumb infection; associated with vomiting - Sertraline and rosuvastatin currently on hold - Protonix  and Carafate  previously held due to side effects and antibiotic use  Constitutional symptoms and functional status - Significant weakness and instability - Recent weight loss and fluid loss contributing to unsteadiness  Alcohol use - Daily alcohol consumption - Attempting to reduce alcohol intake    Previous GI Procedures/Imaging   EGD 08/12/2023 (done by Dr. Suzann for epigastric pain, coffee-ground emesis, melena, nausea with vomiting) - Normal upper third of esophagus.  - LA Grade C erosive esophagitis in the middle and lower esophagus. The tissue was not spontaneously bleeding but did demonstrate contact friability with passage of the endoscope resulting in a small amount of oozing. Suspect this is a likely source of patient' s coffee- ground emesis and melena.  - Benign- appearing esophageal stenosis at the GE junction  - Gastritis.  - Normal gastric body and antrum.  - Small hiatal hernia.  - Erythematous duodenopathy.  - Normal second portion of the duodenum.  - No specimens collected.  Colonoscopy 10/08/2022 - One 8 mm polyp in the transverse colon, removed with a cold snare. Resected and retrieved.  - Non-bleeding external and internal hemorrhoids.  - The examination was otherwise normal. - Recall 5 years  EGD 06-26-22  - Benign-appearing esophageal stenosis, dilated with endoscope during insertion.  - LA Grade B reflux esophagitis with no bleeding. - Alcoholic gastritis.  - Normal examined duodenum.  - No  specimens collected.  Liver elastography 06/19/2022 IMPRESSION: ULTRASOUND LIVER:   Hepatic steatosis.   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 Esophageal biopsies consistent with acid reflux esophagitis negative for EOE   EGD 02/20/2022: Proximal esophageal stricture   US  Abdomen Limited RUQ 01-15-22 No cholelithiasis or sonographic evidence for acute cholecystitis Increased hepatic parenchymal echogenicity suggestive of steatosis.   Colonoscopy 05/03/2019: 3 dimunitive tubular adenomas were removed   colonoscopy 03/20/2016: 4 sessile diminutive polyps [2 tubular adenomas 1 sessile serrated adenoma and hyperplastic polyp] and internal hemorrhoids.   Past Medical History:  Diagnosis Date   Anxiety    Arthritis    Rhuematoid arthritis   Blood transfusion without reported diagnosis 2007   Cancer [facility]) 2012   skin cancer; face   Complication of anesthesia    airway obstruction during endo procedure   Elevated LFTs    GERD (gastroesophageal reflux disease)    Hemorrhoids    History of colon polyps    Hypoglycemia    Rosacea     Past Surgical History:  Procedure Laterality Date   BIOPSY  04/21/2022   Procedure: BIOPSY;  Surgeon: Shila Gustav GAILS, MD;  Location: [facility];  Service: Gastroenterology;;   ESOPHAGOGASTRODUODENOSCOPY N/A 04/21/2022   Procedure: ESOPHAGOGASTRODUODENOSCOPY (EGD);  Surgeon: Nandigam, Kavitha V, MD;  Location: [facility];  Service: Gastroenterology;  Laterality: N/A;   ESOPHAGOGASTRODUODENOSCOPY N/A 08/12/2023   Procedure: EGD (ESOPHAGOGASTRODUODENOSCOPY);  Surgeon: Suzann Inocente HERO, MD;  Location: [facility];  Service: Gastroenterology;  Laterality:  N/A;   FINGER SURGERY     calcium deposits removed from both midle fingers, thumbs and r elbow   KNEE ARTHROSCOPY Left 1994   MOHS SURGERY  2012   OTHER SURGICAL  HISTORY  1996   surgery for skin cancer   TUBAL LIGATION  1995   UTERINE FIBROID SURGERY  2004    Current Outpatient Medications  Medication Sig Dispense Refill   cephALEXin (KEFLEX) 500 MG capsule Take 500 mg by mouth 4 (four) times daily.     folic acid  (FOLVITE ) 1 MG tablet Take 1 tablet (1 mg total) by mouth daily. 30 tablet 0   HUMIRA PEN 40 MG/0.4ML PNKT Inject 40 mg into the skin every 14 (fourteen) days.     Multiple Vitamin (MULTIVITAMIN) tablet Take 1 tablet by mouth 3 (three) times a week.     ondansetron  (ZOFRAN -ODT) 4 MG disintegrating tablet Take 1 tablet (4 mg total) by mouth every 8 (eight) hours as needed for nausea or vomiting. 20 tablet 0   pantoprazole  (PROTONIX ) 40 MG tablet Take 1 tablet (40 mg total) by mouth 2 (two) times daily. 180 tablet 3   thiamine  (VITAMIN B-1) 100 MG tablet Take 1 tablet (100 mg total) by mouth daily. 30 tablet 0   oxyCODONE  (OXY IR/ROXICODONE ) 5 MG immediate release tablet Take 5 mg by mouth every 4 (four) hours as needed. (Patient not taking: Reported on 04/05/2024)     rosuvastatin (CRESTOR) 5 MG tablet Take 5 mg by mouth daily. (Patient not taking: Reported on 04/05/2024)     sertraline (ZOLOFT) 50 MG tablet Take 50 mg by mouth daily. (Patient not taking: Reported on 04/05/2024)     No current facility-administered medications for this visit.    Allergies as of 04/05/2024 - Review Complete 04/05/2024  Allergen Reaction Noted   Fluorouracil Other (See Comments) 08/12/2023   Doxycycline Nausea And Vomiting 06/05/2022   Hydroxychloroquine Nausea And Vomiting 06/05/2022   Other Nausea And Vomiting 09/02/2022    Family History  Problem Relation Age of Onset   Stomach cancer Mother    Diabetes Father    Parkinson's disease Father    Rectal cancer Sister    Colon cancer Neg Hx    Esophageal cancer Neg Hx     Social History   Tobacco Use   Smoking status: Never   Smokeless tobacco: Never  Vaping Use   Vaping status: Never Used   Substance Use Topics   Alcohol use: Yes    Alcohol/week: 21.0 standard drinks of alcohol    Types: 21 Glasses of wine per week    Comment: pt states she stopped drinking 07/21/2022   09-21-23- Does not tell anyone   Drug use: No     Review of Systems:    Constitutional: Unintentional weight loss and chills.  No fever Cardiovascular: No chest pain Respiratory: No SOB Gastrointestinal: See HPI and otherwise negative   Physical Exam:  Vital signs: BP (!) 90/50 (BP Location: Left Arm, Patient Position: Sitting, Cuff Size: Normal)   Pulse 84 Comment: irregular  Ht 5' 3.25 (1.607 m) Comment: height measured without shoes  Wt 121 lb 8 oz (55.1 kg)   BMI 21.35 kg/m   Constitutional: Pleasant female in NAD, alert and cooperative Head:  Normocephalic and atraumatic.  Eyes: Possibly some mild scleral icterus Respiratory: Respirations even and unlabored. Lungs clear to auscultation bilaterally.  No wheezes, crackles, or rhonchi.  Cardiovascular:  Regular rate and rhythm. No murmurs. No peripheral edema. Gastrointestinal:  Soft, nondistended, tender to palpation of epigastrium. No rebound or guarding. Normal bowel sounds. No appreciable masses or hepatomegaly. Rectal:  Not performed.  Neurologic:  Alert and oriented x4;  grossly normal neurologically.  Skin:   Dry and intact without significant lesions or rashes. Psychiatric: Oriented to person, place and time. Demonstrates good judgement and reason without abnormal affect or behaviors.   RELEVANT LABS AND IMAGING: CBC    Component Value Date/Time   WBC 7.7 03/27/2024 0923   RBC 4.00 03/27/2024 0923   HGB 13.4 03/27/2024 0923   HCT 39.7 03/27/2024 0923   PLT 179 03/27/2024 0923   MCV 99.3 03/27/2024 0923   MCH 33.5 03/27/2024 0923   MCHC 33.8 03/27/2024 0923   RDW 11.9 03/27/2024 0923   LYMPHSABS 1.6 03/04/2023 1217   MONOABS 0.9 03/04/2023 1217   EOSABS 0.1 03/04/2023 1217   BASOSABS 0.1 03/04/2023 1217    CMP      Component Value Date/Time   NA 131 (L) 03/27/2024 0923   K 4.1 03/27/2024 0923   CL 93 (L) 03/27/2024 0923   CO2 24 03/27/2024 0923   GLUCOSE 119 (H) 03/27/2024 0923   BUN 7 (L) 03/27/2024 0923   CREATININE 0.52 03/27/2024 0923   CALCIUM 10.2 03/27/2024 0923   PROT 8.8 (H) 03/27/2024 0923   ALBUMIN 4.5 03/27/2024 0923   AST 125 (H) 03/27/2024 0923   ALT 52 (H) 03/27/2024 0923   ALKPHOS 90 03/27/2024 0923   BILITOT 1.2 03/27/2024 0923   GFRNONAA >60 03/27/2024 0923   Echocardiogram 03/04/2023 1. Left ventricular ejection fraction, by estimation, is 60 to 65% . The left ventricle has normal function. The left ventricle has no regional wall motion abnormalities. Left ventricular diastolic parameters were normal. The average left ventricular global longitudinal strain is - 18. 1 % . The global longitudinal strain is normal.  2. Right ventricular systolic function is normal. The right ventricular size is normal. There is normal pulmonary artery systolic pressure. The estimated right ventricular systolic pressure is 32. 6 mmHg.  3. The mitral valve is degenerative. Mild mitral valve regurgitation. No evidence of mitral stenosis.  4. The aortic valve is tricuspid. Aortic valve regurgitation is not visualized. Aortic valve sclerosis/ calcification is present, without any evidence of aortic stenosis. Aortic valve area, by VTI measures 1. 74 cm . Aortic valve mean gradient measures 5. 5 mmHg. Aortic valve Vmax measures 1. 52 m/ s.  5. The inferior vena cava is normal in size with greater than 50% respiratory variability, suggesting right atrial pressure of 3 mmHg.  Assessment/Plan:   Assessment & Plan Decreased appetite Nausea and vomiting Epigastric pain Early satiety GERD Alcohol use Diarrhea Patient with ongoing nausea, vomiting, and diarrhea.  On most recent EGD found to have LA grade C erosive esophagitis, gastritis.  Seen in the ED 03/27/2024 for current symptoms.  Labs notable for  mild elevation in lipase, elevated LFTs.  Advised to have close follow-up with GI for further evaluation of possible alcohol-induced gastritis/esophagitis.  Patient continues to drink alcohol daily.  - Started pantoprazole  40 mg twice daily. - Started Carafate  (sucralfate ) twice daily, with instructions to dissolve in water if needed. - Scheduled upper endoscopy. I thoroughly discussed the procedure with the patient to include nature of the procedure, alternatives, benefits, and risks (including but not limited to bleeding, infection, perforation, anesthesia/cardiac/pulmonary complications). Patient verbalized understanding and gave verbal consent to proceed with procedure.  - Stool studies: C. difficile, stool culture, fecal pancreatic elastase -  Prescribed ondansetron  for nausea as needed. - Advised on dietary modifications: bland, soft foods, and adequate hydration with electrolytes. - Instructed to monitor for worsening symptoms, fever, or inability to tolerate liquids, and to seek ER care if these occur. - Encouraged alcohol cessation - Consider abdominal imaging if persistent/worsening epigastric pain - If persistent diarrhea consider colonoscopy with biopsies for microscopic colitis  Unintentional weight loss and generalized weakness Unintentional weight loss of 5 pounds over two weeks due to decreased intake from nausea and vomiting. Generalized weakness likely from nutritional deficiencies and fluid loss.   - Encouraged nutritional intake with bland, soft foods and adequate hydration. - Encouraged alcohol cessation  Elevated liver enzymes in the setting of alcohol use Elevated liver enzymes potentially exacerbated by daily alcohol use. Goal to reduce and stop alcohol consumption.  - Advised cessation of alcohol use. - Continue to monitor liver enzyme levels.   Dr. Shila also spoke with patient today, noted possible mild scleral icterus, reviewed recent labs, patient has had a  normal bilirubin.  Dr. Shila will repeat labs at time of endoscopy if scleral icterus appears to be worsening.   [name], PA-C [facility] 04/05/2024, 8:41 AM  Patient Care Team: Dayna Motto, DO as PCP - General (Family Medicine) Patel, Donika K, DO as Consulting Physician (Neurology)

## 2024-04-05 NOTE — Patient Instructions (Addendum)
 Your provider has requested that you go to the basement level for lab work before leaving today. Press B on the elevator. The lab is located at the first door on the left as you exit the elevator.   Please return stool studies tomorrow 04/06/24.  We have sent the following medications to your pharmacy for you to pick up at your convenience: Protonix  (to be taken indefinitely), Carafate  and Zofran   You have been scheduled for an endoscopy. Please follow written instructions given to you at your visit today.  If you use inhalers (even only as needed), please bring them with you on the day of your procedure.  If you take any of the following medications, they will need to be adjusted prior to your procedure:   DO NOT TAKE 7 DAYS PRIOR TO TEST- Trulicity (dulaglutide) Ozempic, Wegovy (semaglutide) Mounjaro, Zepbound (tirzepatide) Bydureon Bcise (exanatide extended release)  DO NOT TAKE 1 DAY PRIOR TO YOUR TEST Rybelsus (semaglutide) Adlyxin (lixisenatide) Victoza (liraglutide) Byetta (exanatide) ___________________________________________________________________________

## 2024-04-06 ENCOUNTER — Other Ambulatory Visit

## 2024-04-07 LAB — CLOSTRIDIUM DIFFICILE BY PCR: Toxigenic C. Difficile by PCR: NEGATIVE

## 2024-04-09 ENCOUNTER — Encounter: Payer: Self-pay | Admitting: Gastroenterology

## 2024-04-10 ENCOUNTER — Encounter: Admitting: Gastroenterology

## 2024-04-10 LAB — STOOL CULTURE: E coli, Shiga toxin Assay: NEGATIVE

## 2024-04-12 LAB — PANCREATIC ELASTASE, FECAL: Pancreatic Elastase-1, Stool: >800 ug/g (ref >200)

## 2024-04-13 ENCOUNTER — Ambulatory Visit: Payer: Self-pay | Admitting: Gastroenterology

## 2024-05-04 ENCOUNTER — Encounter: Payer: Self-pay | Admitting: Gastroenterology

## 2024-05-11 ENCOUNTER — Other Ambulatory Visit: Payer: Self-pay

## 2024-05-11 ENCOUNTER — Ambulatory Visit: Admitting: Gastroenterology

## 2024-05-11 ENCOUNTER — Encounter: Payer: Self-pay | Admitting: Gastroenterology

## 2024-05-11 VITALS — BP 121/71 | HR 75 | Temp 97.3°F | Resp 22 | Ht 63.25 in | Wt 121.8 lb

## 2024-05-11 DIAGNOSIS — K222 Esophageal obstruction: Secondary | ICD-10-CM

## 2024-05-11 DIAGNOSIS — R634 Abnormal weight loss: Secondary | ICD-10-CM

## 2024-05-11 DIAGNOSIS — K209 Esophagitis, unspecified without bleeding: Secondary | ICD-10-CM

## 2024-05-11 DIAGNOSIS — K2211 Ulcer of esophagus with bleeding: Secondary | ICD-10-CM | POA: Diagnosis not present

## 2024-05-11 MED ORDER — SODIUM CHLORIDE 0.9 % IV SOLN: 500.0000 mL | Freq: Once | INTRAVENOUS | Status: DC

## 2024-05-11 MED ORDER — PANTOPRAZOLE SODIUM 40 MG PO TBEC: 40.0000 mg | DELAYED_RELEASE_TABLET | Freq: Two times a day (BID) | ORAL | 3 refills | Status: DC

## 2024-05-11 NOTE — Progress Notes (Unsigned)
 Sedate, gd SR, tolerated procedure well, VSS, report to RN

## 2024-05-11 NOTE — Progress Notes (Unsigned)
 [facility] History and Physical   Primary Care Physician:  Dayna Motto, DO   Reason for Procedure:  Unintentional weight loss, GERD, epigastric pain, nausea and vomiting  Plan:    EGD with possible interventions as needed     HPI: Ana Graves is a very pleasant 68 y.o. female here for EGD for unintentional weight loss, GERD, epigastric pain, nausea and vomiting.  Please refer to office visit note [name]   The risks and benefits as well as alternatives of endoscopic procedure(s) have been discussed and reviewed.  The patient was provided an opportunity to ask questions and all were answered. The patient agreed with the plan and demonstrated an understanding of the instructions.   Past Medical History:  Diagnosis Date   Anxiety    Arthritis    Rhuematoid arthritis   Blood transfusion without reported diagnosis 2007   Cancer [facility]) 2012   skin cancer; face   Complication of anesthesia    airway obstruction during endo procedure   Elevated LFTs    GERD (gastroesophageal reflux disease)    Hemorrhoids    History of colon polyps    Hyperlipidemia    Hypertension    Hypoglycemia    OSA (obstructive sleep apnea)    Rosacea     Past Surgical History:  Procedure Laterality Date   BIOPSY  04/21/2022   Procedure: BIOPSY;  Surgeon: Shila Gustav GAILS, MD;  Location: [facility];  Service: Gastroenterology;;   ESOPHAGOGASTRODUODENOSCOPY N/A 04/21/2022   Procedure: ESOPHAGOGASTRODUODENOSCOPY (EGD);  Surgeon: Sou Nohr V, MD;  Location: [facility];  Service: Gastroenterology;  Laterality: N/A;   ESOPHAGOGASTRODUODENOSCOPY N/A 08/12/2023   Procedure: EGD (ESOPHAGOGASTRODUODENOSCOPY);  Surgeon: Suzann Inocente HERO, MD;  Location: [facility];  Service: Gastroenterology;  Laterality: N/A;   FINGER SURGERY     calcium deposits removed from both midle fingers, thumbs and r elbow   KNEE ARTHROSCOPY Left 1994   MOHS SURGERY  2012   OTHER SURGICAL  HISTORY  1996   surgery for skin cancer   TUBAL LIGATION  1995   UTERINE FIBROID SURGERY  2004    Prior to Admission medications  Medication Sig Start Date End Date Taking? Authorizing Provider  adalimumab (HUMIRA, 2 PEN,) 40 MG/0.4ML pen 0.4 mL Subcutaneous every 2 weeks; Duration: 84 days   Yes [provider]  Multiple Vitamin (MULTIVITAMIN) tablet Take 1 tablet by mouth 3 (three) times a week.   Yes [provider]  thiamine  (VITAMIN B-1) 100 MG tablet Take 1 tablet (100 mg total) by mouth daily. 08/14/23  Yes Regalado, Belkys A, MD  amLODipine (NORVASC) 5 MG tablet 1 tablet Orally Once a day; Duration: 90 days Patient not taking: Reported on 05/11/2024 04/13/24   [provider]  cephALEXin (KEFLEX) 500 MG capsule Take 500 mg by mouth 4 (four) times daily. Patient not taking: Reported on 05/11/2024    [provider]  folic acid  (FOLVITE ) 1 MG tablet Take 1 tablet (1 mg total) by mouth daily. 08/14/23   Regalado, Belkys A, MD  HUMIRA PEN 40 MG/0.4ML PNKT Inject 40 mg into the skin every 14 (fourteen) days. 02/02/22   [provider]  ondansetron  (ZOFRAN -ODT) 4 MG disintegrating tablet Take 1 tablet (4 mg total) by mouth every 8 (eight) hours as needed for nausea or vomiting. 04/05/24   Heinz, Sara E, PA-C  oxyCODONE  (OXY IR/ROXICODONE ) 5 MG immediate release tablet Take 5 mg by mouth every 4 (four) hours as needed. Patient not taking: No sig  reported    [provider]  pantoprazole  (PROTONIX ) 40 MG tablet Take 1 tablet (40 mg total) by mouth 2 (two) times daily. 04/05/24   Heinz, Sara E, PA-C  rosuvastatin (CRESTOR) 5 MG tablet Take 5 mg by mouth daily. Patient not taking: Reported on 04/05/2024 12/14/23   [provider]  sertraline  (ZOLOFT ) 50 MG tablet Take 50 mg by mouth daily. Patient not taking: Reported on 04/05/2024    [provider]  sucralfate  (CARAFATE ) 1 g tablet Crush one tablet and dissolve in 10 ml of  warm water Two to three times daily. Mix well and drink. 04/05/24   Heinz, Sara E, PA-C    Current Outpatient Medications  Medication Sig Dispense Refill   adalimumab (HUMIRA, 2 PEN,) 40 MG/0.4ML pen 0.4 mL Subcutaneous every 2 weeks; Duration: 84 days     Multiple Vitamin (MULTIVITAMIN) tablet Take 1 tablet by mouth 3 (three) times a week.     thiamine  (VITAMIN B-1) 100 MG tablet Take 1 tablet (100 mg total) by mouth daily. 30 tablet 0   amLODipine (NORVASC) 5 MG tablet 1 tablet Orally Once a day; Duration: 90 days (Patient not taking: Reported on 05/11/2024)     cephALEXin (KEFLEX) 500 MG capsule Take 500 mg by mouth 4 (four) times daily. (Patient not taking: Reported on 05/11/2024)     folic acid  (FOLVITE ) 1 MG tablet Take 1 tablet (1 mg total) by mouth daily. 30 tablet 0   HUMIRA PEN 40 MG/0.4ML PNKT Inject 40 mg into the skin every 14 (fourteen) days.     ondansetron  (ZOFRAN -ODT) 4 MG disintegrating tablet Take 1 tablet (4 mg total) by mouth every 8 (eight) hours as needed for nausea or vomiting. 30 tablet 1   oxyCODONE  (OXY IR/ROXICODONE ) 5 MG immediate release tablet Take 5 mg by mouth every 4 (four) hours as needed. (Patient not taking: No sig reported)     pantoprazole  (PROTONIX ) 40 MG tablet Take 1 tablet (40 mg total) by mouth 2 (two) times daily. 180 tablet 3   rosuvastatin (CRESTOR) 5 MG tablet Take 5 mg by mouth daily. (Patient not taking: Reported on 04/05/2024)     sertraline  (ZOLOFT ) 50 MG tablet Take 50 mg by mouth daily. (Patient not taking: Reported on 04/05/2024)     sucralfate  (CARAFATE ) 1 g tablet Crush one tablet and dissolve in 10 ml of warm water Two to three times daily. Mix well and drink. 60 tablet 1   Current Facility-Administered Medications  Medication Dose Route Frequency Provider Last Rate Last Admin   0.9 %  sodium chloride  infusion  500 mL Intravenous Once Threasa Kinch V, MD        Allergies as of 05/11/2024 - Review Complete 04/09/2024  Allergen  Reaction Noted   Doxycycline Nausea And Vomiting 06/05/2022   Fluorouracil Nausea And Vomiting and Rash 08/12/2023   Hydroxychloroquine Nausea And Vomiting 06/05/2022   Other Nausea And Vomiting 09/02/2022    Family History  Problem Relation Age of Onset   Stomach cancer Mother    Diabetes Father    Parkinson's disease Father    Rectal cancer Sister    Colon cancer Neg Hx    Esophageal cancer Neg Hx     Social History   Socioeconomic History   Marital status: Married    Spouse name: Not on file   Number of children: Not on file   Years of education: Not on file   Highest education level: Not on file  Occupational  History   Not on file  Tobacco Use   Smoking status: Never   Smokeless tobacco: Never  Vaping Use   Vaping status: Never Used  Substance and Sexual Activity   Alcohol use: Yes    Alcohol/week: 21.0 standard drinks of alcohol    Types: 21 Glasses of wine per week    Comment: pt states she stopped drinking 07/21/2022   09-21-23- Does not tell anyone   Drug use: No   Sexual activity: Not on file  Other Topics Concern   Not on file  Social History Narrative   Are you right handed or left handed? Both    Are you currently employed ? No    What is your current occupation? Retired   Do you live at home alone? No   Who lives with you? Husband- Sharolyn Ferries and has two big dogs    What type of home do you live in: 1 story or 2 story? Lives in a two story home with the main living area on the first floor. Patient is very careful on stairs.        Social Drivers of Health   Tobacco Use: Low Risk (05/11/2024)   Patient History    Smoking Tobacco Use: Never    Smokeless Tobacco Use: Never    Passive Exposure: Not on file  Financial Resource Strain: Not on file  Food Insecurity: No Food Insecurity (08/11/2023)   Hunger Vital Sign    Worried About Running Out of Food in the Last Year: Never true    Ran Out of Food in the Last Year: Never true  Transportation Needs:  No Transportation Needs (08/11/2023)   PRAPARE - Administrator, Civil Service (Medical): No    Lack of Transportation (Non-Medical): No  Physical Activity: Not on file  Stress: Not on file  Social Connections: Unknown (08/12/2023)   Social Connection and Isolation Panel    Frequency of Communication with Friends and Family: Not on file    Frequency of Social Gatherings with Friends and Family: Not on file    Attends Religious Services: Not on file    Active Member of Clubs or Organizations: Not on file    Attends Banker Meetings: Not on file    Marital Status: Married  Intimate Partner Violence: Not At Risk (08/12/2023)   Humiliation, Afraid, Rape, and Kick questionnaire    Fear of Current or Ex-Partner: No    Emotionally Abused: No    Physically Abused: No    Sexually Abused: No  Depression (PHQ2-9): Not on file  Alcohol Screen: Not on file  Housing: Low Risk (08/11/2023)   Housing Stability Vital Sign    Unable to Pay for Housing in the Last Year: No    Number of Times Moved in the Last Year: 0    Homeless in the Last Year: No  Utilities: Not At Risk (08/11/2023)   [employer] Utilities    Threatened with loss of utilities: No  Health Literacy: Not on file    Review of Systems:  All other review of systems negative except as mentioned in the HPI.  Physical Exam: Vital signs in last 24 hours: BP 115/69   Pulse 77   Temp (!) 97.3 F (36.3 C)   Ht 5' 3.25 (1.607 m)   Wt 121 lb 12.8 oz (55.2 kg)   SpO2 97%   BMI 21.41 kg/m  General:   Alert, NAD Lungs:  Clear .   Heart:  Regular rate and rhythm Abdomen:  Soft, nontender and nondistended. Neuro/Psych:  Alert and cooperative. Normal mood and affect. A and O x 3  Reviewed labs, radiology imaging, old records and pertinent past GI work up  Patient is appropriate for planned procedure(s) and anesthesia in an ambulatory setting   K. Veena Alithea Lapage , MD [phone]

## 2024-05-11 NOTE — Op Note (Signed)
 [facility] Patient Name: Ana Graves Procedure Date: 05/11/2024 10:06 AM MRN: [id number] Endoscopist: Gustav ALONSO Mcgee , MD, [id number] Age: 68 Referring MD:  Date of Birth: 10/30/1955 Gender: Female Account #: [id number] Procedure:                Upper GI endoscopy Indications:              Dysphagia, Epigastric abdominal pain, Nausea with                            vomiting, weight loss Medicines:                Monitored Anesthesia Care Procedure:                Pre-Anesthesia Assessment:                           - Prior to the procedure, a History and Physical                            was performed, and patient medications and                            allergies were reviewed. The patient's tolerance of                            previous anesthesia was also reviewed. The risks                            and benefits of the procedure and the sedation                            options and risks were discussed with the patient.                            All questions were answered, and informed consent                            was obtained. Prior Anticoagulants: The patient has                            taken no anticoagulant or antiplatelet agents. ASA                            Grade Assessment: II - A patient with mild systemic                            disease. After reviewing the risks and benefits,                            the patient was deemed in satisfactory condition to                            undergo the procedure.  After obtaining informed consent, the endoscope was                            passed under direct vision. Throughout the                            procedure, the patient's blood pressure, pulse, and                            oxygen saturations were monitored continuously. The                            Olympus Scope SN [id number] was introduced through the                            mouth, with  the intention of advancing to the                            duodenum. The scope was advanced to the middle                            third of the esophagus before the procedure was                            aborted. Medications were given. The upper GI                            endoscopy was accomplished without difficulty. The                            patient tolerated the procedure well. Scope In: Scope Out: Findings:                 LA Grade D (one or more mucosal breaks involving at                            least 75% of esophageal circumference) esophagitis                            with bleeding was found 23 to 30 cm from the                            incisors. Biopsies were taken with a cold forceps                            for histology.                           One benign-appearing, intrinsic severe (stenosis;                            an endoscope cannot pass) stenosis was found 30 to  31 cm from the incisors. This stenosis measured 8                            mm (inner diameter). The stenosis was not traversed. Complications:            No immediate complications. Estimated Blood Loss:     Estimated blood loss was minimal. Impression:               - LA Grade D erosive esophagitis with bleeding.                            Biopsied.                           - Benign-appearing esophageal stenosis. Recommendation:           - Full liquid diet and slowly advance to mechanical                            soft diet as tolerated.                           - Continue present medications.                           - Pantoprazole  40mg  twice daily                           - Await pathology results.                           - Repeat upper endoscopy tomorrow at 11 AM at [facility] for dilation of                            esophagus. Ana Sculley V. Carnetta Losada, MD 05/11/2024 10:41:21 AM This report has  been signed electronically.

## 2024-05-11 NOTE — Progress Notes (Unsigned)
 Called to room to assist during endoscopic procedure.  Patient ID and intended procedure confirmed with present staff. Received instructions for my participation in the procedure from the performing physician.

## 2024-05-11 NOTE — Patient Instructions (Addendum)
-  Await pathology results -Pantoprazole  40 mg twice a day -Full liquid diet and slowly advance to mechanical soft diet as tolerated (handout provided)  YOU HAD AN ENDOSCOPIC PROCEDURE TODAY AT THE [facility]:   Refer to the procedure report that was given to you for any specific questions about what was found during the examination.  If the procedure report does not answer your questions, please call your gastroenterologist to clarify.  If you requested that your care partner not be given the details of your procedure findings, then the procedure report has been included in a sealed envelope for you to review at your convenience later.  YOU SHOULD EXPECT: Some feelings of bloating in the abdomen. Passage of more gas than usual.  Walking can help get rid of the air that was put into your GI tract during the procedure and reduce the bloating. If you had a lower endoscopy (such as a colonoscopy or flexible sigmoidoscopy) you may notice spotting of blood in your stool or on the toilet paper. If you underwent a bowel prep for your procedure, you may not have a normal bowel movement for a few days.  Please Note:  You might notice some irritation and congestion in your nose or some drainage.  This is from the oxygen used during your procedure.  There is no need for concern and it should clear up in a day or so.  SYMPTOMS TO REPORT IMMEDIATELY:  Following upper endoscopy (EGD)  Vomiting of blood or coffee ground material  New chest pain or pain under the shoulder blades  Painful or persistently difficult swallowing  New shortness of breath  Fever of 100F or higher  Black, tarry-looking stools  For urgent or emergent issues, a gastroenterologist can be reached at any hour by calling [phone]. Do not use MyChart messaging for urgent concerns.    DIET:  We do recommend a small meal at first, but then you may proceed to your regular diet.  Drink plenty of fluids but you should avoid  alcoholic beverages for 24 hours.  ACTIVITY:  You should plan to take it easy for the rest of today and you should NOT DRIVE or use heavy machinery until tomorrow (because of the sedation medicines used during the test).    FOLLOW UP: Our staff will call the number listed on your records the next business day following your procedure.  We will call around 7:15- 8:00 am to check on you and address any questions or concerns that you may have regarding the information given to you following your procedure. If we do not reach you, we will leave a message.     If any biopsies were taken you will be contacted by phone or by letter within the next 1-3 weeks.  Please call us  at [phone] if you have not heard about the biopsies in 3 weeks.    SIGNATURES/CONFIDENTIALITY: You and/or your care partner have signed paperwork which will be entered into your electronic medical record.  These signatures attest to the fact that that the information above on your After Visit Summary has been reviewed and is understood.  Full responsibility of the confidentiality of this discharge information lies with you and/or your care-partner.

## 2024-05-11 NOTE — Progress Notes (Unsigned)
 Vs by EJ

## 2024-05-12 ENCOUNTER — Ambulatory Visit (HOSPITAL_COMMUNITY): Payer: Self-pay | Admitting: Anesthesiology

## 2024-05-12 ENCOUNTER — Telehealth: Payer: Self-pay

## 2024-05-12 ENCOUNTER — Encounter (HOSPITAL_COMMUNITY): Payer: Self-pay | Admitting: Gastroenterology

## 2024-05-12 ENCOUNTER — Ambulatory Visit: Admitting: Physician Assistant

## 2024-05-12 ENCOUNTER — Other Ambulatory Visit: Payer: Self-pay

## 2024-05-12 ENCOUNTER — Ambulatory Visit (HOSPITAL_COMMUNITY)
Admission: RE | Admit: 2024-05-12 | Discharge: 2024-05-12 | Disposition: A | Discharge status: Home / Self Care | Attending: Gastroenterology | Admitting: Gastroenterology

## 2024-05-12 ENCOUNTER — Encounter (HOSPITAL_COMMUNITY): Admission: RE | Disposition: A | Payer: Self-pay | Source: Home / Self Care | Attending: Gastroenterology

## 2024-05-12 DIAGNOSIS — K222 Esophageal obstruction: Secondary | ICD-10-CM | POA: Diagnosis present

## 2024-05-12 DIAGNOSIS — Z79899 Other long term (current) drug therapy: Secondary | ICD-10-CM | POA: Diagnosis not present

## 2024-05-12 DIAGNOSIS — I10 Essential (primary) hypertension: Secondary | ICD-10-CM

## 2024-05-12 DIAGNOSIS — K2210 Ulcer of esophagus without bleeding: Secondary | ICD-10-CM | POA: Diagnosis not present

## 2024-05-12 DIAGNOSIS — R1310 Dysphagia, unspecified: Secondary | ICD-10-CM | POA: Diagnosis not present

## 2024-05-12 DIAGNOSIS — K449 Diaphragmatic hernia without obstruction or gangrene: Secondary | ICD-10-CM | POA: Insufficient documentation

## 2024-05-12 DIAGNOSIS — F419 Anxiety disorder, unspecified: Secondary | ICD-10-CM | POA: Insufficient documentation

## 2024-05-12 DIAGNOSIS — K219 Gastro-esophageal reflux disease without esophagitis: Secondary | ICD-10-CM | POA: Diagnosis not present

## 2024-05-12 HISTORY — PX: BALLOON DILATION: SHX5330

## 2024-05-12 HISTORY — PX: ESOPHAGOGASTRODUODENOSCOPY: SHX5428

## 2024-05-12 SURGERY — EGD (ESOPHAGOGASTRODUODENOSCOPY)
Anesthesia: Monitor Anesthesia Care

## 2024-05-12 MED ORDER — ONDANSETRON HCL 4 MG/2ML IJ SOLN
INTRAMUSCULAR | Status: DC | PRN
  Administered 2024-05-12: 4 mg via INTRAVENOUS

## 2024-05-12 MED ORDER — SODIUM CHLORIDE 0.9 % IV SOLN: INTRAVENOUS | Status: DC

## 2024-05-12 MED ORDER — PROPOFOL 10 MG/ML IV BOLUS
INTRAVENOUS | Status: DC | PRN
  Administered 2024-05-12 (×2): 20 mg via INTRAVENOUS
  Administered 2024-05-12: 50 mg via INTRAVENOUS

## 2024-05-12 MED ORDER — LIDOCAINE 2% (20 MG/ML) 5 ML SYRINGE
INTRAMUSCULAR | Status: DC | PRN
  Administered 2024-05-12: 100 mg via INTRAVENOUS

## 2024-05-12 MED ORDER — PROPOFOL 500 MG/50ML IV EMUL
INTRAVENOUS | Status: DC | PRN
  Administered 2024-05-12: 175 ug/kg/min via INTRAVENOUS

## 2024-05-12 MED ORDER — SERTRALINE HCL 50 MG PO TABS: 50.0000 mg | ORAL_TABLET | Freq: Every day | ORAL | 0 refills | Status: AC

## 2024-05-12 NOTE — Anesthesia Postprocedure Evaluation (Signed)
"   Anesthesia Post Note  Patient: Ana Graves  Procedure(s) Performed: EGD (ESOPHAGOGASTRODUODENOSCOPY) BALLOON DILATION     Patient location during evaluation: PACU Anesthesia Type: MAC Level of consciousness: awake and alert Pain management: pain level controlled Vital Signs Assessment: post-procedure vital signs reviewed and stable Respiratory status: spontaneous breathing, nonlabored ventilation and respiratory function stable Cardiovascular status: stable and blood pressure returned to baseline Postop Assessment: no apparent nausea or vomiting Anesthetic complications: no   No notable events documented.  Last Vitals:  Vitals:   05/12/24 1230 05/12/24 1240  BP: 106/62 116/74  Pulse: 89 87  Resp: (!) 26 19  Temp:    SpO2: 95% 97%    Last Pain:  Vitals:   05/12/24 1240  TempSrc:   PainSc: 2                  [name],W. EDMOND      "

## 2024-05-12 NOTE — Interval H&P Note (Signed)
 History and Physical Interval Note:  05/12/2024 11:10 AM  Ana Graves  has presented today for surgery, with the diagnosis of dysphagia-esophageal stricture.  The various methods of treatment have been discussed with the patient and family. After consideration of risks, benefits and other options for treatment, the patient has consented to  Procedures with comments: EGD (ESOPHAGOGASTRODUODENOSCOPY) (N/A) - dilation as a surgical intervention.  The patient's history has been reviewed, patient examined, no change in status, stable for surgery.  I have reviewed the patient's chart and labs.  Questions were answered to the patient's satisfaction.     Marylu Dudenhoeffer

## 2024-05-12 NOTE — Discharge Instructions (Addendum)
 YOU HAD AN ENDOSCOPIC PROCEDURE TODAY: Refer to the procedure report and other information in the discharge instructions given to you for any specific questions about what was found during the examination. If this information does not answer your questions, please call [facility] office at [phone] to clarify.   YOU SHOULD EXPECT: Some feelings of bloating in the abdomen. Passage of more gas than usual. Walking can help get rid of the air that was put into your GI tract during the procedure and reduce the bloating. Some abdominal soreness may be present for a day or two, also.  DIET: Stay on liquid diet today (fluids, yoghurt, ice cream), advance to soft foods tomorrow if tolerating (eggs etc.). Heavy or fried foods are harder to digest and may make you feel nauseous or bloated. Drink plenty of fluids but you should avoid alcoholic beverages for 24 hours.   ACTIVITY: Your care partner should take you home directly after the procedure. You should plan to take it easy, moving slowly for the rest of the day. You can resume normal activity the day after the procedure however YOU SHOULD NOT DRIVE, use power tools, machinery or perform tasks that involve climbing or major physical exertion for 24 hours (because of the sedation medicines used during the test).   SYMPTOMS TO REPORT IMMEDIATELY: A gastroenterologist can be reached at any hour. Please call [phone]  for any of the following symptoms:   Following upper endoscopy (EGD, EUS, ERCP, esophageal dilation) Vomiting of blood or coffee ground material  New, significant abdominal pain  New, significant chest pain or pain under the shoulder blades  Painful or persistently difficult swallowing  New shortness of breath  Black, tarry-looking or red, bloody stools  FOLLOW UP:  If any biopsies were taken you will be contacted by phone or by letter within the next 1-3 weeks. Call [phone]  if you have not heard about the biopsies in 3 weeks.   Please also call with any specific questions about appointments or follow up tests.

## 2024-05-12 NOTE — Telephone Encounter (Signed)
" °  Follow up Call-     05/11/2024    9:33 AM 10/08/2022    7:17 AM 06/26/2022    9:38 AM 02/20/2022    9:26 AM  Call back number  Post procedure Call Back phone  # [phone]  Permission to leave phone message Yes Yes Yes Yes     Patient questions:  Do you have a fever, pain , or abdominal swelling? No. Pain Score  0 *  Have you tolerated food without any problems? Yes.    Have you been able to return to your normal activities? Yes.    Do you have any questions about your discharge instructions: Diet   No. Medications  No. Follow up visit  No.  Do you have questions or concerns about your Care? No.  Actions: * If pain score is 4 or above: No action needed, pain <4.   "

## 2024-05-12 NOTE — Transfer of Care (Signed)
 Immediate Anesthesia Transfer of Care Note  Patient: Ana Graves  Procedure(s) Performed: EGD (ESOPHAGOGASTRODUODENOSCOPY) BALLOON DILATION  Patient Location: Endoscopy Unit  Anesthesia Type:MAC  Level of Consciousness: awake, alert , and oriented  Airway & Oxygen Therapy: Patient Spontanous Breathing and Patient connected to nasal cannula oxygen  Post-op Assessment: Report given to RN and Post -op Vital signs reviewed and stable  Post vital signs: Reviewed and stable  Last Vitals:  Vitals Value Taken Time  BP 122/99 05/12/24 12:20  Temp    Pulse 103 05/12/24 12:21  Resp 15 05/12/24 12:21  SpO2 92 % 05/12/24 12:21  Vitals shown include unfiled device data.  Last Pain:  Vitals:   05/12/24 0959  TempSrc: Temporal  PainSc: 0-No pain         Complications: No notable events documented.

## 2024-05-12 NOTE — Op Note (Addendum)
 [facility] Patient Name: Ana Graves Procedure Date : 05/12/2024 MRN: [id number] Attending MD: Gustav ALONSO Mcgee , MD, [id number] Date of Birth: 04-17-1956 CSN: [id number] Age: 68 Admit Type: Outpatient Procedure:                Upper GI endoscopy Indications:              Dysphagia, For therapy of esophageal stricture Providers:                Gustav ALONSO Mcgee, MD, Jacquelyn Jaci Pierce,                            RN, Coye Bade, Technician Referring MD:              Medicines:                Monitored Anesthesia Care Complications:            No immediate complications. Estimated Blood Loss:     Estimated blood loss was minimal. Procedure:                Pre-Anesthesia Assessment:                           - Prior to the procedure, a History and Physical                            was performed, and patient medications and                            allergies were reviewed. The patient's tolerance of                            previous anesthesia was also reviewed. The risks                            and benefits of the procedure and the sedation                            options and risks were discussed with the patient.                            All questions were answered, and informed consent                            was obtained. Prior Anticoagulants: The patient has                            taken no anticoagulant or antiplatelet agents. ASA                            Grade Assessment: II - A patient with mild systemic                            disease. After reviewing the risks and benefits,  the patient was deemed in satisfactory condition to                            undergo the procedure.                           After obtaining informed consent, the endoscope was                            passed under direct vision. Throughout the                            procedure, the patient's blood pressure,  pulse, and                            oxygen saturations were monitored continuously. The                            [id number]-[id number] ([id number]) Olympus endoscope was                            introduced through the mouth, and advanced to the                            second part of duodenum. The [id number]-[id number] ([id number])                            Olympus endoscope was introduced through the mouth,                            and advanced to the second part of duodenum. The                            upper GI endoscopy was accomplished without                            difficulty. The patient tolerated the procedure                            well. Scope In: Scope Out: Findings:      EGD first perfomred with slim pediatric EGD, noted severe distal       esophageal stricture, benign-appearing, intrinsic severe (stenosis; an       endoscope cannot pass) stenosis was found 30 to 32 cm from the incisors.       GE junction at 32 cm from incissors. Stomach and duodenum examined and       then scope was replaced with regular EGD. This stenosis measured 8 mm       (inner diameter). The stenosis was traversed after dilating. A TTS       dilator was passed through the scope. Dilation with a 03-05-11 mm x 5.5       cm CRE balloon dilator , wire guided was performed to 12 mm.       Subsequenlty TTS dilator was passed through the scope. Dilation with a  12-13.5-15 mm x 8 cm CRE balloon dilator was performed to 15 mm.      LA Grade D (one or more mucosal breaks involving at least 75% of       esophageal circumference) esophagitis with no bleeding was found 24 to       32 cm from the incisors.      A 5 cm hiatal hernia was present.      The exam of the stomach was otherwise normal.      The examined duodenum was normal. Impression:               - Benign-appearing severe esophageal stenosis.                            Dilated to 15mm.                           - LA Grade D erosive esophagitis with no  bleeding.                           - 5 cm hiatal hernia.                           - Normal examined duodenum.                           - No specimens collected. Recommendation:           - Full liquid diet today, then advance as tolerated                            to mechanical soft diet.                           - Use Protonix  (pantoprazole ) 40 mg PO BID.                           - Follow an antireflux regimen.                           - Repeat EGD in 4- 6 weeks to be scheduled in [facility]                           - Avoid NSAID's Procedure Code(s):        --- Professional ---                           [phone], Esophagogastroduodenoscopy, flexible,                            transoral; with transendoscopic balloon dilation of                            esophagus (less than 30 mm diameter) Diagnosis Code(s):        --- Professional ---                           K22.2, Esophageal  obstruction                           K20.80, Other esophagitis without bleeding                           K44.9, Diaphragmatic hernia without obstruction or                            gangrene                           R13.10, Dysphagia, unspecified CPT copyright 2022 American Medical Association. All rights reserved. The codes documented in this report are preliminary and upon coder review may  be revised to meet current compliance requirements. Tamera Pingley V. Nethaniel Mattie, MD 05/12/2024 12:25:47 PM This report has been signed electronically. Number of Addenda: 0

## 2024-05-12 NOTE — H&P (Signed)
  Gastroenterology History and Physical   Primary Care Physician:  Dayna Motto, DO   Reason for Procedure:   Dysphagia, esophageal stricture  Plan:    EGD with esophageal dilation     HPI: Ana Graves is a 68 y.o. female here for EGD with esophageal dilation for dysphagia, esophageal stricture  The risks and benefits as well as alternatives of endoscopic procedure(s) have been discussed and reviewed. All questions answered. The patient agrees to proceed.     Past Medical History:  Diagnosis Date   Anxiety    Arthritis    Rhuematoid arthritis   Blood transfusion without reported diagnosis 2007   Cancer [facility]) 2012   skin cancer; face   Complication of anesthesia    airway obstruction during endo procedure   Elevated LFTs    GERD (gastroesophageal reflux disease)    Hemorrhoids    History of colon polyps    Hyperlipidemia    Hypertension    Hypoglycemia    Rosacea     Past Surgical History:  Procedure Laterality Date   BIOPSY  04/21/2022   Procedure: BIOPSY;  Surgeon: Shila Gustav GAILS, MD;  Location: [facility];  Service: Gastroenterology;;   ESOPHAGOGASTRODUODENOSCOPY N/A 04/21/2022   Procedure: ESOPHAGOGASTRODUODENOSCOPY (EGD);  Surgeon: Marjorie Lussier V, MD;  Location: [facility];  Service: Gastroenterology;  Laterality: N/A;   ESOPHAGOGASTRODUODENOSCOPY N/A 08/12/2023   Procedure: EGD (ESOPHAGOGASTRODUODENOSCOPY);  Surgeon: Suzann Inocente HERO, MD;  Location: [facility];  Service: Gastroenterology;  Laterality: N/A;   FINGER SURGERY     calcium deposits removed from both midle fingers, thumbs and r elbow   KNEE ARTHROSCOPY Left 1994   MOHS SURGERY  2012   OTHER SURGICAL HISTORY  1996   surgery for skin cancer   TUBAL LIGATION  1995   UTERINE FIBROID SURGERY  2004    Prior to Admission medications  Medication Sig Start Date End Date Taking? Authorizing Provider  pantoprazole  (PROTONIX ) 40 MG tablet Take 1 tablet (40 mg total) by  mouth 2 (two) times daily. 05/11/24  Yes Ahlijah Raia V, MD  adalimumab (HUMIRA, 2 PEN,) 40 MG/0.4ML pen 0.4 mL Subcutaneous every 2 weeks; Duration: 84 days    [provider]  amLODipine (NORVASC) 5 MG tablet 1 tablet Orally Once a day; Duration: 90 days Patient not taking: Reported on 05/11/2024 04/13/24   [provider]  cephALEXin (KEFLEX) 500 MG capsule Take 500 mg by mouth 4 (four) times daily. Patient not taking: Reported on 05/11/2024    [provider]  folic acid  (FOLVITE ) 1 MG tablet Take 1 tablet (1 mg total) by mouth daily. 08/14/23   Regalado, Belkys A, MD  HUMIRA PEN 40 MG/0.4ML PNKT Inject 40 mg into the skin every 14 (fourteen) days. 02/02/22   [provider]  Multiple Vitamin (MULTIVITAMIN) tablet Take 1 tablet by mouth 3 (three) times a week.    [provider]  ondansetron  (ZOFRAN -ODT) 4 MG disintegrating tablet Take 1 tablet (4 mg total) by mouth every 8 (eight) hours as needed for nausea or vomiting. 04/05/24   Heinz, Sara E, PA-C  oxyCODONE  (OXY IR/ROXICODONE ) 5 MG immediate release tablet Take 5 mg by mouth every 4 (four) hours as needed. Patient not taking: No sig reported    [provider]  pantoprazole  (PROTONIX ) 40 MG tablet Take 1 tablet (40 mg total) by mouth 2 (two) times daily. 04/05/24   Heinz, Sara E, PA-C  rosuvastatin (CRESTOR) 5 MG tablet Take 5 mg by mouth daily.  Patient not taking: Reported on 04/05/2024 12/14/23   [provider]  sertraline  (ZOLOFT ) 50 MG tablet Take 50 mg by mouth daily. Patient not taking: Reported on 04/05/2024    [provider]  sucralfate  (CARAFATE ) 1 g tablet Crush one tablet and dissolve in 10 ml of warm water Two to three times daily. Mix well and drink. 04/05/24   Heinz, Camie BRAVO, PA-C  thiamine  (VITAMIN B-1) 100 MG tablet Take 1 tablet (100 mg total) by mouth daily. 08/14/23   Regalado, Owen A, MD    Current Facility-Administered Medications   Medication Dose Route Frequency Provider Last Rate Last Admin   0.9 %  sodium chloride  infusion   Intravenous Continuous Delcie Ruppert V, MD        Allergies as of 05/11/2024 - Review Complete 05/11/2024  Allergen Reaction Noted   Doxycycline Nausea And Vomiting 06/05/2022   Fluorouracil Nausea And Vomiting and Rash 08/12/2023   Hydroxychloroquine Nausea And Vomiting 06/05/2022   Other Nausea And Vomiting 09/02/2022    Family History  Problem Relation Age of Onset   Stomach cancer Mother    Diabetes Father    Parkinson's disease Father    Rectal cancer Sister    Colon cancer Neg Hx    Esophageal cancer Neg Hx     Social History   Socioeconomic History   Marital status: Married    Spouse name: Not on file   Number of children: Not on file   Years of education: Not on file   Highest education level: Not on file  Occupational History   Not on file  Tobacco Use   Smoking status: Never   Smokeless tobacco: Never  Vaping Use   Vaping status: Never Used  Substance and Sexual Activity   Alcohol use: Yes    Alcohol/week: 21.0 standard drinks of alcohol    Types: 21 Glasses of wine per week    Comment: pt states she stopped drinking 07/21/2022   09-21-23- Does not tell anyone   Drug use: No   Sexual activity: Not on file  Other Topics Concern   Not on file  Social History Narrative   Are you right handed or left handed? Both    Are you currently employed ? No    What is your current occupation? Retired   Do you live at home alone? No   Who lives with you? Husband- Sharolyn Ferries and has two big dogs    What type of home do you live in: 1 story or 2 story? Lives in a two story home with the main living area on the first floor. Patient is very careful on stairs.        Social Drivers of Health   Tobacco Use: Low Risk (05/11/2024)   Patient History    Smoking Tobacco Use: Never    Smokeless Tobacco Use: Never    Passive Exposure: Not on file  Financial Resource  Strain: Not on file  Food Insecurity: No Food Insecurity (08/11/2023)   Hunger Vital Sign    Worried About Running Out of Food in the Last Year: Never true    Ran Out of Food in the Last Year: Never true  Transportation Needs: No Transportation Needs (08/11/2023)   PRAPARE - Administrator, Civil Service (Medical): No    Lack of Transportation (Non-Medical): No  Physical Activity: Not on file  Stress: Not on file  Social Connections: Unknown (08/12/2023)   Social Connection and Isolation Panel  Frequency of Communication with Friends and Family: Not on file    Frequency of Social Gatherings with Friends and Family: Not on file    Attends Religious Services: Not on file    Active Member of Clubs or Organizations: Not on file    Attends Banker Meetings: Not on file    Marital Status: Married  Intimate Partner Violence: Not At Risk (08/12/2023)   Humiliation, Afraid, Rape, and Kick questionnaire    Fear of Current or Ex-Partner: No    Emotionally Abused: No    Physically Abused: No    Sexually Abused: No  Depression (PHQ2-9): Not on file  Alcohol Screen: Not on file  Housing: Low Risk (08/11/2023)   Housing Stability Vital Sign    Unable to Pay for Housing in the Last Year: No    Number of Times Moved in the Last Year: 0    Homeless in the Last Year: No  Utilities: Not At Risk (08/11/2023)   [employer] Utilities    Threatened with loss of utilities: No  Health Literacy: Not on file    Review of Systems:  All other review of systems negative except as mentioned in the HPI.  Physical Exam: Vital signs in last 24 hours: Temp:  [97.8 F (36.6 C)] 97.8 F (36.6 C) (12/19 0959) Pulse Rate:  [94] 94 (12/19 0959) Resp:  [16] 16 (12/19 0959) BP: (133)/(82) 133/82 (12/19 0959) SpO2:  [99 %] 99 % (12/19 0959)   General:   Alert, NAD Lungs:  Clear .   Heart:  Regular rate and rhythm Abdomen:  Soft, nontender and nondistended. Neuro/Psych:  Alert and cooperative.  Normal mood and affect. A and O x 3   K. Veena Virgal [place] , MD [phone]

## 2024-05-12 NOTE — Anesthesia Preprocedure Evaluation (Addendum)
"                                    Anesthesia Evaluation  Patient identified by MRN, date of birth, ID band Patient awake    Reviewed: Allergy & Precautions, H&P , NPO status , Patient's Chart, lab work & pertinent test results  Airway Mallampati: II  TM Distance: >3 FB Neck ROM: Full    Dental no notable dental hx. (+) Teeth Intact, Dental Advisory Given   Pulmonary neg pulmonary ROS   Pulmonary exam normal breath sounds clear to auscultation       Cardiovascular hypertension, Pt. on medications  Rhythm:Regular Rate:Normal     Neuro/Psych   Anxiety     negative neurological ROS     GI/Hepatic Neg liver ROS,GERD  Medicated,,  Endo/Other  negative endocrine ROS    Renal/GU negative Renal ROS  negative genitourinary   Musculoskeletal  (+) Arthritis , Osteoarthritis,    Abdominal   Peds  Hematology negative hematology ROS (+)   Anesthesia Other Findings   Reproductive/Obstetrics negative OB ROS                              Anesthesia Physical Anesthesia Plan  ASA: 2  Anesthesia Plan: MAC   Post-op Pain Management: Minimal or no pain anticipated   Induction: Intravenous  PONV Risk Score and Plan: 2 and Propofol  infusion and Treatment may vary due to age or medical condition  Airway Management Planned: Natural Airway and Simple Face Mask  Additional Equipment:   Intra-op Plan:   Post-operative Plan:   Informed Consent: I have reviewed the patients History and Physical, chart, labs and discussed the procedure including the risks, benefits and alternatives for the proposed anesthesia with the patient or authorized representative who has indicated his/her understanding and acceptance.     Dental advisory given  Plan Discussed with: CRNA  Anesthesia Plan Comments:          Anesthesia Quick Evaluation  "

## 2024-05-14 ENCOUNTER — Encounter (HOSPITAL_COMMUNITY): Payer: Self-pay | Admitting: Gastroenterology

## 2024-05-16 ENCOUNTER — Ambulatory Visit: Payer: Self-pay | Admitting: Gastroenterology

## 2024-05-16 LAB — SURGICAL PATHOLOGY

## 2024-05-23 ENCOUNTER — Telehealth: Payer: Self-pay | Admitting: *Deleted

## 2024-05-23 DIAGNOSIS — K222 Esophageal obstruction: Secondary | ICD-10-CM

## 2024-05-23 NOTE — Telephone Encounter (Signed)
 Spoke with pt and informed her of pathology results per Dr. Nandigam.  Repeat EGD scheduled for 06-29-24 at 700 am.  New instructions mailed to pt.

## 2024-06-15 ENCOUNTER — Other Ambulatory Visit: Payer: Self-pay | Admitting: Family Medicine

## 2024-06-15 DIAGNOSIS — R413 Other amnesia: Secondary | ICD-10-CM

## 2024-06-23 ENCOUNTER — Ambulatory Visit
Admission: RE | Admit: 2024-06-23 | Discharge: 2024-06-23 | Disposition: A | Source: Ambulatory Visit | Attending: Family Medicine

## 2024-06-23 DIAGNOSIS — R413 Other amnesia: Secondary | ICD-10-CM

## 2024-06-26 ENCOUNTER — Other Ambulatory Visit

## 2024-06-29 ENCOUNTER — Ambulatory Visit: Admitting: Gastroenterology

## 2024-06-29 ENCOUNTER — Encounter: Payer: Self-pay | Admitting: Gastroenterology

## 2024-06-29 VITALS — BP 125/76 | HR 85 | Temp 98.3°F | Resp 11 | Ht 63.0 in | Wt 121.0 lb

## 2024-06-29 DIAGNOSIS — K222 Esophageal obstruction: Secondary | ICD-10-CM

## 2024-06-29 DIAGNOSIS — R630 Anorexia: Secondary | ICD-10-CM

## 2024-06-29 DIAGNOSIS — K2100 Gastro-esophageal reflux disease with esophagitis, without bleeding: Secondary | ICD-10-CM

## 2024-06-29 DIAGNOSIS — R1319 Other dysphagia: Secondary | ICD-10-CM

## 2024-06-29 MED ORDER — PANTOPRAZOLE SODIUM 40 MG PO TBEC: 40.0000 mg | DELAYED_RELEASE_TABLET | Freq: Two times a day (BID) | ORAL | 3 refills | Status: AC

## 2024-06-29 MED ORDER — SODIUM CHLORIDE 0.9 % IV SOLN: 500.0000 mL | INTRAVENOUS | Status: DC

## 2024-06-29 NOTE — Patient Instructions (Signed)
 Please read handouts provided. Full liquid diet today, then advance as tolerated to mechanical diet. Await pathology results. Follow an anti-reflux regimen. Protonix  ( pantoprazole  ) 40 mg twice daily. Take 1 B-complex tablet daily (OTC). Return to GI office in 2-3 months with Dr. Shila.   YOU HAD AN ENDOSCOPIC PROCEDURE TODAY AT THE [facility]:   Refer to the procedure report that was given to you for any specific questions about what was found during the examination.  If the procedure report does not answer your questions, please call your gastroenterologist to clarify.  If you requested that your care partner not be given the details of your procedure findings, then the procedure report has been included in a sealed envelope for you to review at your convenience later.  YOU SHOULD EXPECT: Some feelings of bloating in the abdomen. Passage of more gas than usual.  Walking can help get rid of the air that was put into your GI tract during the procedure and reduce the bloating. If you had a lower endoscopy (such as a colonoscopy or flexible sigmoidoscopy) you may notice spotting of blood in your stool or on the toilet paper. If you underwent a bowel prep for your procedure, you may not have a normal bowel movement for a few days.  Please Note:  You might notice some irritation and congestion in your nose or some drainage.  This is from the oxygen used during your procedure.  There is no need for concern and it should clear up in a day or so.  SYMPTOMS TO REPORT IMMEDIATELY:  Following upper endoscopy (EGD)  Vomiting of blood or coffee ground material  New chest pain or pain under the shoulder blades  Painful or persistently difficult swallowing  New shortness of breath  Fever of 100F or higher  Black, tarry-looking stools  For urgent or emergent issues, a gastroenterologist can be reached at any hour by calling [phone]. Do not use MyChart messaging for urgent  concerns.    DIET:   Drink plenty of fluids but you should avoid alcoholic beverages for 24 hours.  ACTIVITY:  You should plan to take it easy for the rest of today and you should NOT DRIVE or use heavy machinery until tomorrow (because of the sedation medicines used during the test).    FOLLOW UP: Our staff will call the number listed on your records the next business day following your procedure.  We will call around 7:15- 8:00 am to check on you and address any questions or concerns that you may have regarding the information given to you following your procedure. If we do not reach you, we will leave a message.     If any biopsies were taken you will be contacted by phone or by letter within the next 1-3 weeks.  Please call us  at [phone] if you have not heard about the biopsies in 3 weeks.    SIGNATURES/CONFIDENTIALITY: You and/or your care partner have signed paperwork which will be entered into your electronic medical record.  These signatures attest to the fact that that the information above on your After Visit Summary has been reviewed and is understood.  Full responsibility of the confidentiality of this discharge information lies with you and/or your care-partner.

## 2024-06-29 NOTE — Progress Notes (Unsigned)
 Sedate, gd SR, tolerated procedure well, VSS, report to RN

## 2024-06-29 NOTE — Progress Notes (Unsigned)
 [facility] History and Physical   Primary Care Physician:  Dayna Motto, DO   Reason for Procedure:  Dysphagia, esophageal stricture  Plan:    EGD with possible interventions as needed     HPI: Ana Graves is a very pleasant 69 y.o. female here for EGD for dysphagia, esophageal stricture.  The risks and benefits as well as alternatives of endoscopic procedure(s) have been discussed and reviewed.  The patient was provided an opportunity to ask questions and all were answered. The patient agreed with the plan and demonstrated an understanding of the instructions.   Past Medical History:  Diagnosis Date   Anxiety    Arthritis    Rhuematoid arthritis   Blood transfusion without reported diagnosis 2007   Cancer [facility]) 2012   skin cancer; face   Complication of anesthesia    airway obstruction during endo procedure   Elevated LFTs    GERD (gastroesophageal reflux disease)    Hemorrhoids    History of colon polyps    Hyperlipidemia    Hypertension    Hypoglycemia    Rosacea     Past Surgical History:  Procedure Laterality Date   BALLOON DILATION N/A 05/12/2024   Procedure: BALLOON DILATION;  Surgeon: Shila Gustav GAILS, MD;  Location: [facility];  Service: Gastroenterology;  Laterality: N/A;   BIOPSY  04/21/2022   Procedure: BIOPSY;  Surgeon: Shila Gustav GAILS, MD;  Location: [facility];  Service: Gastroenterology;;   ESOPHAGOGASTRODUODENOSCOPY N/A 04/21/2022   Procedure: ESOPHAGOGASTRODUODENOSCOPY (EGD);  Surgeon: Lizmary Nader V, MD;  Location: [facility];  Service: Gastroenterology;  Laterality: N/A;   ESOPHAGOGASTRODUODENOSCOPY N/A 08/12/2023   Procedure: EGD (ESOPHAGOGASTRODUODENOSCOPY);  Surgeon: Suzann Inocente HERO, MD;  Location: [facility];  Service: Gastroenterology;  Laterality: N/A;   ESOPHAGOGASTRODUODENOSCOPY N/A 05/12/2024   Procedure: EGD (ESOPHAGOGASTRODUODENOSCOPY);  Surgeon: Daisa Stennis V, MD;  Location[facility];  Service: Gastroenterology;  Laterality: N/A;  dilation   FINGER SURGERY     calcium deposits removed from both midle fingers, thumbs and r elbow   KNEE ARTHROSCOPY Left 1994   MOHS SURGERY  2012   OTHER SURGICAL HISTORY  1996   surgery for skin cancer   TUBAL LIGATION  1995   UTERINE FIBROID SURGERY  2004    Prior to Admission medications  Medication Sig Start Date End Date Taking? Authorizing Provider  adalimumab (HUMIRA, 2 PEN,) 40 MG/0.4ML pen 0.4 mL Subcutaneous every 2 weeks; Duration: 84 days   Yes [provider]  Multiple Vitamin (MULTIVITAMIN) tablet Take 1 tablet by mouth 3 (three) times a week.   Yes [provider]  pantoprazole  (PROTONIX ) 40 MG tablet Take 1 tablet (40 mg total) by mouth 2 (two) times daily. 05/11/24  Yes Keen Ewalt V, MD  sertraline  (ZOLOFT ) 50 MG tablet Take 1 tablet (50 mg total) by mouth daily. 05/12/24  Yes Mardell Suttles V, MD  thiamine  (VITAMIN B-1) 100 MG tablet Take 1 tablet (100 mg total) by mouth daily. 08/14/23  Yes Regalado, Belkys A, MD  amLODipine (NORVASC) 5 MG tablet 1 tablet Orally Once a day; Duration: 90 days Patient not taking: Reported on 06/29/2024 04/13/24   [provider]  folic acid  (FOLVITE ) 1 MG tablet Take 1 tablet (1 mg total) by mouth daily. Patient not taking: Reported on 06/29/2024 08/14/23   Regalado, Owen A, MD  HUMIRA PEN 40 MG/0.4ML PNKT Inject 40 mg into the skin every 14 (fourteen) days. 02/02/22   [provider]  ondansetron  (ZOFRAN -ODT) 4 MG  disintegrating tablet Take 1 tablet (4 mg total) by mouth every 8 (eight) hours as needed for nausea or vomiting. 04/05/24   Heinz, Camie BRAVO, PA-C  pantoprazole  (PROTONIX ) 40 MG tablet Take 1 tablet (40 mg total) by mouth 2 (two) times daily. 04/05/24   Heinz, Sara E, PA-C  rosuvastatin (CRESTOR) 5 MG tablet Take 5 mg by mouth daily. Patient not taking: Reported on 04/05/2024 12/14/23   [provider]  sucralfate   (CARAFATE ) 1 g tablet Crush one tablet and dissolve in 10 ml of warm water Two to three times daily. Mix well and drink. Patient not taking: Reported on 06/29/2024 04/05/24   Arletta Camie BRAVO, PA-C    Current Outpatient Medications  Medication Sig Dispense Refill   adalimumab (HUMIRA, 2 PEN,) 40 MG/0.4ML pen 0.4 mL Subcutaneous every 2 weeks; Duration: 84 days     Multiple Vitamin (MULTIVITAMIN) tablet Take 1 tablet by mouth 3 (three) times a week.     pantoprazole  (PROTONIX ) 40 MG tablet Take 1 tablet (40 mg total) by mouth 2 (two) times daily. 180 tablet 3   sertraline  (ZOLOFT ) 50 MG tablet Take 1 tablet (50 mg total) by mouth daily. 90 tablet 0   thiamine  (VITAMIN B-1) 100 MG tablet Take 1 tablet (100 mg total) by mouth daily. 30 tablet 0   amLODipine (NORVASC) 5 MG tablet 1 tablet Orally Once a day; Duration: 90 days (Patient not taking: Reported on 06/29/2024)     folic acid  (FOLVITE ) 1 MG tablet Take 1 tablet (1 mg total) by mouth daily. (Patient not taking: Reported on 06/29/2024) 30 tablet 0   HUMIRA PEN 40 MG/0.4ML PNKT Inject 40 mg into the skin every 14 (fourteen) days.     ondansetron  (ZOFRAN -ODT) 4 MG disintegrating tablet Take 1 tablet (4 mg total) by mouth every 8 (eight) hours as needed for nausea or vomiting. 30 tablet 1   pantoprazole  (PROTONIX ) 40 MG tablet Take 1 tablet (40 mg total) by mouth 2 (two) times daily. 180 tablet 3   rosuvastatin (CRESTOR) 5 MG tablet Take 5 mg by mouth daily. (Patient not taking: Reported on 04/05/2024)     sucralfate  (CARAFATE ) 1 g tablet Crush one tablet and dissolve in 10 ml of warm water Two to three times daily. Mix well and drink. (Patient not taking: Reported on 06/29/2024) 60 tablet 1   Current Facility-Administered Medications  Medication Dose Route Frequency Provider Last Rate Last Admin   0.9 %  sodium chloride  infusion  500 mL Intravenous Continuous Rage Beever V, MD        Allergies as of 06/29/2024 - Review Complete 06/29/2024   Allergen Reaction Noted   Doxycycline Nausea And Vomiting 06/05/2022   Fluorouracil Nausea And Vomiting and Rash 08/12/2023   Hydroxychloroquine Nausea And Vomiting 06/05/2022   Other Nausea And Vomiting 09/02/2022    Family History  Problem Relation Age of Onset   Stomach cancer Mother    Diabetes Father    Parkinson's disease Father    Rectal cancer Sister    Colon cancer Neg Hx    Esophageal cancer Neg Hx     Social History   Socioeconomic History   Marital status: Married    Spouse name: Not on file   Number of children: Not on file   Years of education: Not on file   Highest education level: Not on file  Occupational History   Not on file  Tobacco Use   Smoking status: Never   Smokeless tobacco: Never  Vaping Use   Vaping status: Never Used  Substance and Sexual Activity   Alcohol use: Yes    Alcohol/week: 21.0 standard drinks of alcohol    Types: 21 Glasses of wine per week    Comment: pt states she stopped drinking 07/21/2022   09-21-23- Does not tell anyone   Drug use: No   Sexual activity: Not on file  Other Topics Concern   Not on file  Social History Narrative   Are you right handed or left handed? Both    Are you currently employed ? No    What is your current occupation? Retired   Do you live at home alone? No   Who lives with you? Husband- Sharolyn Ferries and has two big dogs    What type of home do you live in: 1 story or 2 story? Lives in a two story home with the main living area on the first floor. Patient is very careful on stairs.        Social Drivers of Health   Tobacco Use: Low Risk (06/29/2024)   Patient History    Smoking Tobacco Use: Never    Smokeless Tobacco Use: Never    Passive Exposure: Not on file  Financial Resource Strain: Not on file  Food Insecurity: No Food Insecurity (08/11/2023)   Hunger Vital Sign    Worried About Running Out of Food in the Last Year: Never true    Ran Out of Food in the Last Year: Never true   Transportation Needs: No Transportation Needs (08/11/2023)   PRAPARE - Administrator, Civil Service (Medical): No    Lack of Transportation (Non-Medical): No  Physical Activity: Not on file  Stress: Not on file  Social Connections: Unknown (08/12/2023)   Social Connection and Isolation Panel    Frequency of Communication with Friends and Family: Not on file    Frequency of Social Gatherings with Friends and Family: Not on file    Attends Religious Services: Not on file    Active Member of Clubs or Organizations: Not on file    Attends Banker Meetings: Not on file    Marital Status: Married  Intimate Partner Violence: Not At Risk (08/12/2023)   Humiliation, Afraid, Rape, and Kick questionnaire    Fear of Current or Ex-Partner: No    Emotionally Abused: No    Physically Abused: No    Sexually Abused: No  Depression (PHQ2-9): Not on file  Alcohol Screen: Not on file  Housing: Low Risk (08/11/2023)   Housing Stability Vital Sign    Unable to Pay for Housing in the Last Year: No    Number of Times Moved in the Last Year: 0    Homeless in the Last Year: No  Utilities: Not At Risk (08/11/2023)   [employer] Utilities    Threatened with loss of utilities: No  Health Literacy: Not on file    Review of Systems:  All other review of systems negative except as mentioned in the HPI.  Physical Exam: Vital signs in last 24 hours: BP 103/61   Pulse 77   Temp 98.3 F (36.8 C)   Resp 15   Ht 5' 3 (1.6 m)   Wt 121 lb (54.9 kg)   SpO2 92%   BMI 21.43 kg/m  General:   Alert, NAD Lungs:  Clear .   Heart:  Regular rate and rhythm Abdomen:  Soft, nontender and nondistended. Neuro/Psych:  Alert and cooperative. Normal mood and  affect. A and O x 3  Reviewed labs, radiology imaging, old records and pertinent past GI work up  Patient is appropriate for planned procedure(s) and anesthesia in an ambulatory setting   K. Veena Danyal Whitenack , MD [phone]

## 2024-06-29 NOTE — Progress Notes (Unsigned)
 Called to room to assist during endoscopic procedure.  Patient ID and intended procedure confirmed with present staff. Received instructions for my participation in the procedure from the performing physician.

## 2024-06-29 NOTE — Op Note (Signed)
 [facility] Patient Name: Ana Graves Procedure Date: 06/29/2024 8:07 AM MRN: [id number] Endoscopist: Gustav ALONSO Mcgee , MD, [id number] Age: 69 Referring MD:  Date of Birth: 06-Oct-1955 Gender: Female Account #: [id number] Procedure:                Upper GI endoscopy Indications:              Dysphagia, Follow-up of reflux esophagitis, For                            therapy of esophageal stricture Medicines:                Monitored Anesthesia Care Procedure:                Pre-Anesthesia Assessment:                           - Prior to the procedure, a History and Physical                            was performed, and patient medications and                            allergies were reviewed. The patient's tolerance of                            previous anesthesia was also reviewed. The risks                            and benefits of the procedure and the sedation                            options and risks were discussed with the patient.                            All questions were answered, and informed consent                            was obtained. Prior Anticoagulants: The patient has                            taken no anticoagulant or antiplatelet agents. ASA                            Grade Assessment: II - A patient with mild systemic                            disease. After reviewing the risks and benefits,                            the patient was deemed in satisfactory condition to                            undergo the procedure.  After obtaining informed consent, the endoscope was                            passed under direct vision. Throughout the                            procedure, the patient's blood pressure, pulse, and                            oxygen saturations were monitored continuously. The                            Olympus Scope [id number] was introduced through the                            mouth, and  advanced to the second part of duodenum.                            The upper GI endoscopy was accomplished without                            difficulty. The patient tolerated the procedure                            well. Scope In: Scope Out: Findings:                 LA Grade C (one or more mucosal breaks continuous                            between tops of 2 or more mucosal folds, less than                            75% circumference) esophagitis with no bleeding was                            found 25 to 36 cm from the incisors. Biopsies were                            taken with a cold forceps for histology. Biopsies                            were obtained from the proximal and distal                            esophagus with cold forceps for histology of                            suspected eosinophilic esophagitis.                           A few benign-appearing, intrinsic mild stenoses  were found 30 to 36 cm from the incisors. The                            narrowest stenosis measured 1.7 cm (inner                            diameter). The stenoses were traversed. A TTS                            dilator was passed through the scope. Dilation with                            an 18-19-20 mm x 8 cm CRE balloon dilator was                            performed to 20 mm.                           Small hiatal hernia                           The stomach was normal.                           The cardia and gastric fundus were normal on                            retroflexion.                           The examined duodenum was normal. Complications:            No immediate complications. Estimated Blood Loss:     Estimated blood loss was minimal. Impression:               - LA Grade C reflux esophagitis with no bleeding.                            Biopsied.                           - Benign-appearing esophageal stenoses. Dilated.                            - Normal stomach.                           - Normal examined duodenum.                           - Biopsies were taken with a cold forceps for                            evaluation of eosinophilic esophagitis. Recommendation:           - Full liquid diet today, then advance as tolerated  to mechanical soft diet.                           - Continue present medications.                           - Await pathology results.                           - Follow an antireflux regimen.                           - Use Protonix  (pantoprazole ) 40 mg PO BID. Rx for                            90 days with 3 refills                           - Take 1 B-complex tablet daily (OTC)                           - Return to GI office in 2-3 months with                            Dr.Elmus Mathes. Rhilyn Battle V. Jaidan Prevette, MD 06/29/2024 8:47:38 AM This report has been signed electronically.

## 2024-06-30 ENCOUNTER — Telehealth: Payer: Self-pay

## 2024-06-30 NOTE — Telephone Encounter (Signed)
" °  Follow up Call-     06/29/2024    7:40 AM 05/11/2024    9:33 AM 10/08/2022    7:17 AM 06/26/2022    9:38 AM 02/20/2022    9:26 AM  Call back number  Post procedure Call Back phone  # [phone]  Permission to leave phone message Yes Yes Yes Yes Yes     Patient questions:  Do you have a fever, pain , or abdominal swelling? No. Pain Score  0 *  Have you tolerated food without any problems? Yes.    Have you been able to return to your normal activities? Yes.    Do you have any questions about your discharge instructions: Diet   No. Medications  No. Follow up visit  No.  Do you have questions or concerns about your Care? No.  Actions: * If pain score is 4 or above: No action needed, pain <4.   "
